# Patient Record
Sex: Female | Born: 1985 | Race: White | Hispanic: No | Marital: Single | Smoking: Former smoker
Health system: Southern US, Community
[De-identification: ages and names within clinical notes are randomized; demographics above are authoritative.]

## PROBLEM LIST (undated history)

## (undated) DIAGNOSIS — F32A Depression, unspecified: Secondary | ICD-10-CM

## (undated) DIAGNOSIS — F419 Anxiety disorder, unspecified: Secondary | ICD-10-CM

## (undated) DIAGNOSIS — F329 Major depressive disorder, single episode, unspecified: Secondary | ICD-10-CM

## (undated) HISTORY — DX: Depression, unspecified: F32.A

## (undated) HISTORY — PX: APPENDECTOMY: SHX54

## (undated) HISTORY — DX: Anxiety disorder, unspecified: F41.9

## (undated) HISTORY — DX: Major depressive disorder, single episode, unspecified: F32.9

---
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
## 2013-11-21 ENCOUNTER — Ambulatory Visit (INDEPENDENT_AMBULATORY_CARE_PROVIDER_SITE_OTHER): Payer: Medicaid Other | Admitting: Nurse Practitioner

## 2013-11-21 ENCOUNTER — Encounter (INDEPENDENT_AMBULATORY_CARE_PROVIDER_SITE_OTHER): Payer: Self-pay

## 2013-11-21 ENCOUNTER — Encounter: Payer: Self-pay | Admitting: Nurse Practitioner

## 2013-11-21 DIAGNOSIS — F3289 Other specified depressive episodes: Secondary | ICD-10-CM

## 2013-11-21 DIAGNOSIS — F329 Major depressive disorder, single episode, unspecified: Secondary | ICD-10-CM

## 2013-11-21 DIAGNOSIS — Z713 Dietary counseling and surveillance: Secondary | ICD-10-CM

## 2013-11-21 DIAGNOSIS — F32A Depression, unspecified: Secondary | ICD-10-CM

## 2013-11-21 DIAGNOSIS — R635 Abnormal weight gain: Secondary | ICD-10-CM

## 2013-11-21 DIAGNOSIS — Z789 Other specified health status: Secondary | ICD-10-CM

## 2013-11-21 MED ORDER — NORGESTIM-ETH ESTRAD TRIPHASIC 0.18/0.215/0.25 MG-35 MCG PO TABS: 1.0000 | ORAL_TABLET | Freq: Every day | ORAL | Status: DC

## 2013-11-21 MED ORDER — VENLAFAXINE HCL ER 75 MG PO CP24: 75.0000 mg | ORAL_CAPSULE | Freq: Every day | ORAL | Status: DC

## 2013-11-21 NOTE — Patient Instructions (Signed)

## 2013-11-21 NOTE — Progress Notes (Signed)
   Subjective:    Patient ID: Tara Randall, female    DOB: 09/15/1985, 28 y.o.   MRN: [id number]  HPI Patient here today to discuss going back on meds: Depression Patient has put herself back on effexor when she quit breast feeding. And she needs a refill on meds-SHe does real good on effecxor and would like to stay on it. Birth Control Wants to go back on orthotricyclen since she is no longer breast feeding. LMP 11/01/13- normal  Review of Systems  Constitutional: Negative.   HENT: Negative.   Respiratory: Negative.   Cardiovascular: Negative.   Gastrointestinal: Negative.   Genitourinary: Negative.   Psychiatric/Behavioral: Negative.   All other systems reviewed and are negative.      Objective:   Physical Exam  Constitutional: She is oriented to person, place, and time. She appears well-developed and well-nourished.  HENT:  Nose: Nose normal.  Mouth/Throat: Oropharynx is clear and moist.  Eyes: EOM are normal.  Neck: Trachea normal, normal range of motion and full passive range of motion without pain. Neck supple. No JVD present. Carotid bruit is not present. No thyromegaly present.  Cardiovascular: Normal rate, regular rhythm, normal heart sounds and intact distal pulses.  Exam reveals no gallop and no friction rub.   No murmur heard. Pulmonary/Chest: Effort normal and breath sounds normal.  Abdominal: Soft. Bowel sounds are normal. She exhibits no distension and no mass. There is no tenderness.  Musculoskeletal: Normal range of motion.  Lymphadenopathy:    She has no cervical adenopathy.  Neurological: She is alert and oriented to person, place, and time. She has normal reflexes.  Skin: Skin is warm and dry.  Psychiatric: She has a normal mood and affect. Her behavior is normal. Judgment and thought content normal.     BP 126/89  Pulse 98  Temp(Src) 98.2 F (36.8 C) (Oral)  Ht 5' 5" (1.651 m)  Wt 265 lb (120.203 kg)  BMI 44.10 kg/m2      Assessment & Plan:    1. Severe obesity (BMI >= 40)   2. Weight loss counseling, encounter for   3. Depression   4. Uses birth control    Orders Placed This Encounter  Procedures  . CMP14+EGFR  . NMR, lipoprofile  . Thyroid Panel With TSH   Meds ordered this encounter  Medications  . Norgestimate-Ethinyl Estradiol Triphasic (ORTHO TRI-CYCLEN, 28,) 0.18/0.215/0.25 MG-35 MCG tablet    Sig: Take 1 tablet by mouth daily.    Dispense:  1 Package    Refill:  11    Order Specific Question:  Supervising Provider    Answer:  MOORE, DONALD W [[id number]]  . venlafaxine XR (EFFEXOR XR) 75 MG 24 hr capsule    Sig: Take 1 capsule (75 mg total) by mouth daily with breakfast.    Dispense:  30 capsule    Refill:  5    Order Specific Question:  Supervising Provider    Answer:  MOORE, DONALD W [[id number]]    Labs pending Health maintenance reviewed Diet and exercise encouraged Continue all meds Follow up  In 6 months   Mary-Margaret , FNP    

## 2013-11-22 LAB — CMP14+EGFR
A/G RATIO: 1.9 (ref 1.1–2.5)
ALT: 19 IU/L (ref 0–32)
AST: 19 IU/L (ref 0–40)
Albumin: 4.3 g/dL (ref 3.5–5.5)
Alkaline Phosphatase: 76 IU/L (ref 39–117)
BUN/Creatinine Ratio: 16 (ref 8–20)
BUN: 14 mg/dL (ref 6–20)
CO2: 26 mmol/L (ref 18–29)
Calcium: 9.5 mg/dL (ref 8.7–10.2)
Chloride: 100 mmol/L (ref 97–108)
Creatinine, Ser: 0.9 mg/dL (ref 0.57–1.00)
GFR calc Af Amer: 101 mL/min/{1.73_m2} (ref >59)
GFR, EST NON AFRICAN AMERICAN: 87 mL/min/{1.73_m2} (ref >59)
Globulin, Total: 2.3 g/dL (ref 1.5–4.5)
Glucose: 89 mg/dL (ref 65–99)
POTASSIUM: 4.6 mmol/L (ref 3.5–5.2)
SODIUM: 139 mmol/L (ref 134–144)
TOTAL PROTEIN: 6.6 g/dL (ref 6.0–8.5)
Total Bilirubin: 0.3 mg/dL (ref 0.0–1.2)

## 2013-11-22 LAB — THYROID PANEL WITH TSH
Free Thyroxine Index: 2.2 (ref 1.2–4.9)
T3 Uptake Ratio: 27 % (ref 24–39)
T4 TOTAL: 8 ug/dL (ref 4.5–12.0)
TSH: 1.4 u[IU]/mL (ref 0.450–4.500)

## 2013-11-22 LAB — NMR, LIPOPROFILE
Cholesterol: 186 mg/dL (ref <200)
HDL Cholesterol by NMR: 57 mg/dL (ref >=40)
HDL Particle Number: 40.4 umol/L (ref >=30.5)
LDL Particle Number: 1049 nmol/L — ABNORMAL HIGH (ref <1000)
LDL SIZE: 21.8 nm (ref >20.5)
LDLC SERPL CALC-MCNC: 101 mg/dL — ABNORMAL HIGH (ref <100)
LP-IR Score: 45 (ref <=45)
SMALL LDL PARTICLE NUMBER: 212 nmol/L (ref <=527)
TRIGLYCERIDES BY NMR: 141 mg/dL (ref <150)

## 2013-12-08 ENCOUNTER — Telehealth: Payer: Self-pay | Admitting: Nurse Practitioner

## 2013-12-08 NOTE — Telephone Encounter (Signed)
[facility] but patient did not come to pick up for 9 days and it was put back in stock.

## 2014-01-26 ENCOUNTER — Ambulatory Visit (INDEPENDENT_AMBULATORY_CARE_PROVIDER_SITE_OTHER): Payer: Medicaid Other | Admitting: Nurse Practitioner

## 2014-01-26 ENCOUNTER — Encounter: Payer: Self-pay | Admitting: Nurse Practitioner

## 2014-01-26 VITALS — BP 120/80 | HR 87 | Temp 98.7°F | Ht 65.0 in | Wt 273.8 lb

## 2014-01-26 DIAGNOSIS — F329 Major depressive disorder, single episode, unspecified: Secondary | ICD-10-CM | POA: Insufficient documentation

## 2014-01-26 DIAGNOSIS — B373 Candidiasis of vulva and vagina: Secondary | ICD-10-CM

## 2014-01-26 DIAGNOSIS — N898 Other specified noninflammatory disorders of vagina: Secondary | ICD-10-CM

## 2014-01-26 DIAGNOSIS — N760 Acute vaginitis: Secondary | ICD-10-CM

## 2014-01-26 DIAGNOSIS — F32A Depression, unspecified: Secondary | ICD-10-CM

## 2014-01-26 DIAGNOSIS — L293 Anogenital pruritus, unspecified: Secondary | ICD-10-CM

## 2014-01-26 DIAGNOSIS — B3731 Acute candidiasis of vulva and vagina: Secondary | ICD-10-CM

## 2014-01-26 DIAGNOSIS — A499 Bacterial infection, unspecified: Secondary | ICD-10-CM

## 2014-01-26 DIAGNOSIS — F3289 Other specified depressive episodes: Secondary | ICD-10-CM

## 2014-01-26 DIAGNOSIS — F411 Generalized anxiety disorder: Secondary | ICD-10-CM | POA: Insufficient documentation

## 2014-01-26 DIAGNOSIS — F988 Other specified behavioral and emotional disorders with onset usually occurring in childhood and adolescence: Secondary | ICD-10-CM | POA: Insufficient documentation

## 2014-01-26 DIAGNOSIS — B9689 Other specified bacterial agents as the cause of diseases classified elsewhere: Secondary | ICD-10-CM

## 2014-01-26 LAB — POCT WET PREP (WET MOUNT)
KOH WET PREP POC: NEGATIVE
Trichomonas Wet Prep HPF POC: NEGATIVE

## 2014-01-26 MED ORDER — METRONIDAZOLE 500 MG PO TABS: 500.0000 mg | ORAL_TABLET | Freq: Two times a day (BID) | ORAL | Status: DC

## 2014-01-26 MED ORDER — FLUCONAZOLE 150 MG PO TABS: ORAL_TABLET | ORAL | Status: DC

## 2014-01-26 MED ORDER — AMPHETAMINE-DEXTROAMPHET ER 20 MG PO CP24
20.0000 mg | ORAL_CAPSULE | Freq: Every day | ORAL | Status: DC
Start: 2014-01-26 — End: 2014-03-27

## 2014-01-26 MED ORDER — AMPHETAMINE-DEXTROAMPHET ER 20 MG PO CP24: 20.0000 mg | ORAL_CAPSULE | ORAL | Status: DC

## 2014-01-26 NOTE — Progress Notes (Signed)
Subjective:    Patient ID: Tara Randall, female    DOB: 12-12-1985, 28 y.o.   MRN: [id number]  HPI  PAtient here today to get med refills- She had stop taking all of her meds about 1 1/2 years ago because she got preganant and had to stop- She has started back on her effexor since then. Has been waiting on  Medical records to treat her ADD- she was on adderall 20 last time she had filled- She states that she looses concentration and is unable to complete tasks. Needs to get back on her adderall.  * She has a yeast infection and has been treating it with OTC medication with no relief * bil esar pain intermittently X 1 week   Review of Systems  Constitutional: Positive for fever.  HENT: Positive for ear pain. Negative for congestion.   Respiratory: Negative for cough and shortness of breath.   Cardiovascular: Negative.   Gastrointestinal: Negative.   Genitourinary: Negative.   Neurological: Negative.   Psychiatric/Behavioral: Negative.   All other systems reviewed and are negative.      Objective:   Physical Exam  Constitutional: She is oriented to person, place, and time. She appears well-developed and well-nourished.  HENT:  Nose: Nose normal.  Mouth/Throat: Oropharynx is clear and moist.  Eyes: EOM are normal.  Neck: Trachea normal, normal range of motion and full passive range of motion without pain. Neck supple. No JVD present. Carotid bruit is not present. No thyromegaly present.  Cardiovascular: Normal rate, regular rhythm, normal heart sounds and intact distal pulses.  Exam reveals no gallop and no friction rub.   No murmur heard. Pulmonary/Chest: Effort normal and breath sounds normal.  Abdominal: Soft. Bowel sounds are normal. She exhibits no distension and no mass. There is no tenderness.  Musculoskeletal: Normal range of motion.  Lymphadenopathy:    She has no cervical adenopathy.  Neurological: She is alert and oriented to person, place, and time. She has normal  reflexes.  Skin: Skin is warm and dry.  Birth mark on left anterior chest wall  Psychiatric: She has a normal mood and affect. Her behavior is normal. Judgment and thought content normal.   BP 120/80  Pulse 87  Temp(Src) 98.7 F (37.1 C) (Oral)  Ht 5\' 5"  (1.651 m)  Wt 273 lb 12.8 oz (124.195 kg)  BMI 45.56 kg/m2  LMP 12/29/2013   Results for orders placed in visit on 01/26/14  POCT WET PREP ([place] [place])      Result Value Ref Range   Source Wet Prep POC vagina     WBC, Wet Prep HPF POC 1-5     Bacteria Wet Prep HPF POC many     Clue Cells Wet Prep HPF POC Few     Yeast Wet Prep HPF POC None     KOH Wet Prep POC NEG     Trichomonas Wet Prep HPF POC NEG          Assessment & Plan:  1. Depression 2. GAD (generalized anxiety disorder) Continue effexor as rx Stress managemant  3. Vaginal itching - POCT Wet Prep [facility]([place] [place])  4. Vaginal candidiasis No douching No bubble baths - fluconazole (DIFLUCAN) 150 MG tablet; 1 po now and repeat in 1 week  Dispense: 2 tablet; Refill: 0  5. Bacterial vaginosis - metroNIDAZOLE (FLAGYL) 500 MG tablet; Take 1 tablet (500 mg total) by mouth 2 (two) times daily.  Dispense: 14 tablet; Refill: 0  6. ADD (attention deficit  disorder) Follow up in 2 months ofr medication refills - amphetamine-dextroamphetamine (ADDERALL XR) 20 MG 24 hr capsule; Take 1 capsule (20 mg total) by mouth every morning.  Dispense: 30 capsule; Refill: 0 - amphetamine-dextroamphetamine (ADDERALL XR) 20 MG 24 hr capsule; Take 1 capsule (20 mg total) by mouth daily.  Dispense: 30 capsule; Refill: 0   Mary-Margaret Daphine DeutscherMartin, FNP

## 2014-01-26 NOTE — Patient Instructions (Signed)
Bacterial Vaginosis Bacterial vaginosis is a vaginal infection that occurs when the normal balance of bacteria in the vagina is disrupted. It results from an overgrowth of certain bacteria. This is the most common vaginal infection in women of childbearing age. Treatment is important to prevent complications, especially in pregnant women, as it can cause a premature delivery. CAUSES  Bacterial vaginosis is caused by an increase in harmful bacteria that are normally present in smaller amounts in the vagina. Several different kinds of bacteria can cause bacterial vaginosis. However, the reason that the condition develops is not fully understood. RISK FACTORS Certain activities or behaviors can put you at an increased risk of developing bacterial vaginosis, including:  Having a new sex partner or multiple sex partners.  Douching.  Using an intrauterine device (IUD) for contraception. Women do not get bacterial vaginosis from toilet seats, bedding, swimming pools, or contact with objects around them. SIGNS AND SYMPTOMS  Some women with bacterial vaginosis have no signs or symptoms. Common symptoms include:  Grey vaginal discharge.  A fishlike odor with discharge, especially after sexual intercourse.  Itching or burning of the vagina and vulva.  Burning or pain with urination. DIAGNOSIS  Your health care provider will take a medical history and examine the vagina for signs of bacterial vaginosis. A sample of vaginal fluid may be taken. Your health care provider will look at this sample under a microscope to check for bacteria and abnormal cells. A vaginal pH test may also be done.  TREATMENT  Bacterial vaginosis may be treated with antibiotic medicines. These may be given in the form of a pill or a vaginal cream. A second round of antibiotics may be prescribed if the condition comes back after treatment.  HOME CARE INSTRUCTIONS   Only take over-the-counter or prescription medicines as  directed by your health care provider.  If antibiotic medicine was prescribed, take it as directed. Make sure you finish it even if you start to feel better.  Do not have sex until treatment is completed.  Tell all sexual partners that you have a vaginal infection. They should see their health care provider and be treated if they have problems, such as a mild rash or itching.  Practice safe sex by using condoms and only having one sex partner. SEEK MEDICAL CARE IF:   Your symptoms are not improving after 3 days of treatment.  You have increased discharge or pain.  You have a fever. MAKE SURE YOU:   Understand these instructions.  Will watch your condition.  Will get help right away if you are not doing well or get worse. FOR MORE INFORMATION  Centers for Disease Control and Prevention, Division of STD Prevention: www.cdc.gov/std American Sexual Health Association (ASHA): www.ashastd.org  Document Released: 07/17/2005 Document Revised: 05/07/2013 Document Reviewed: 02/26/2013 ExitCare Patient Information 2015 [employer]. This information is not intended to replace advice given to you by your health care provider. Make sure you discuss any questions you have with your health care provider.  

## 2014-03-27 ENCOUNTER — Encounter: Payer: Self-pay | Admitting: Nurse Practitioner

## 2014-03-27 ENCOUNTER — Ambulatory Visit (INDEPENDENT_AMBULATORY_CARE_PROVIDER_SITE_OTHER): Payer: Medicaid Other | Admitting: Nurse Practitioner

## 2014-03-27 VITALS — BP 138/86 | HR 85 | Temp 97.2°F | Ht 65.0 in | Wt 272.0 lb

## 2014-03-27 DIAGNOSIS — F988 Other specified behavioral and emotional disorders with onset usually occurring in childhood and adolescence: Secondary | ICD-10-CM

## 2014-03-27 DIAGNOSIS — F411 Generalized anxiety disorder: Secondary | ICD-10-CM

## 2014-03-27 MED ORDER — VENLAFAXINE HCL ER 150 MG PO CP24: 150.0000 mg | ORAL_CAPSULE | Freq: Every day | ORAL | Status: DC

## 2014-03-27 MED ORDER — AMPHETAMINE-DEXTROAMPHET ER 20 MG PO CP24: 20.0000 mg | ORAL_CAPSULE | Freq: Every day | ORAL | Status: DC

## 2014-03-27 MED ORDER — AMPHETAMINE-DEXTROAMPHET ER 20 MG PO CP24: 20.0000 mg | ORAL_CAPSULE | ORAL | Status: DC

## 2014-03-27 NOTE — Patient Instructions (Signed)
Stress and Stress Management Stress is a normal reaction to life events. It is what you feel when life demands more than you are used to or more than you can handle. Some stress can be useful. For example, the stress reaction can help you catch the last bus of the day, study for a test, or meet a deadline at work. But stress that occurs too often or for too long can cause problems. It can affect your emotional health and interfere with relationships and normal daily activities. Too much stress can weaken your immune system and increase your risk for physical illness. If you already have a medical problem, stress can make it worse. CAUSES  All sorts of life events may cause stress. An event that causes stress for one person may not be stressful for another person. Major life events commonly cause stress. These may be positive or negative. Examples include losing your job, moving into a new home, getting married, having a baby, or losing a loved one. Less obvious life events may also cause stress, especially if they occur day after day or in combination. Examples include working long hours, driving in traffic, caring for children, being in debt, or being in a difficult relationship. SIGNS AND SYMPTOMS Stress may cause emotional symptoms including, the following:  Anxiety. This is feeling worried, afraid, on edge, overwhelmed, or out of control.  Anger. This is feeling irritated or impatient.  Depression. This is feeling sad, down, helpless, or guilty.  Difficulty focusing, remembering, or making decisions. Stress may cause physical symptoms, including the following:   Aches and pains. These may affect your head, neck, back, stomach, or other areas of your body.  Tight muscles or clenched jaw.  Low energy or trouble sleeping. Stress may cause unhealthy behaviors, including the following:   Eating to feel better (overeating) or skipping meals.  Sleeping too little, too much, or both.  Working  too much or putting off tasks (procrastination).  Smoking, drinking alcohol, or using drugs to feel better. DIAGNOSIS  Stress is diagnosed through an assessment by your health care provider. Your health care provider will ask questions about your symptoms and any stressful life events.Your health care provider will also ask about your medical history and may order blood tests or other tests. Certain medical conditions and medicine can cause physical symptoms similar to stress. Mental illness can cause emotional symptoms and unhealthy behaviors similar to stress. Your health care provider may refer you to a mental health professional for further evaluation.  TREATMENT  Stress management is the recommended treatment for stress.The goals of stress management are reducing stressful life events and coping with stress in healthy ways.  Techniques for reducing stressful life events include the following:  Stress identification. Self-monitor for stress and identify what causes stress for you. These skills may help you to avoid some stressful events.  Time management. Set your priorities, keep a calendar of events, and learn to say "no." These tools can help you avoid making too many commitments. Techniques for coping with stress include the following:  Rethinking the problem. Try to think realistically about stressful events rather than ignoring them or overreacting. Try to find the positives in a stressful situation rather than focusing on the negatives.  Exercise. Physical exercise can release both physical and emotional tension. The key is to find a form of exercise you enjoy and do it regularly.  Relaxation techniques. These relax the body and mind. Examples include yoga, meditation, tai chi, biofeedback, deep  breathing, progressive muscle relaxation, listening to music, being out in nature, journaling, and other hobbies. Again, the key is to find one or more that you enjoy and can do  regularly.  Healthy lifestyle. Eat a balanced diet, get plenty of sleep, and do not smoke. Avoid using alcohol or drugs to relax.  Strong support network. Spend time with family, friends, or other people you enjoy being around.Express your feelings and talk things over with someone you trust. Counseling or talktherapy with a mental health professional may be helpful if you are having difficulty managing stress on your own. Medicine is typically not recommended for the treatment of stress.Talk to your health care provider if you think you need medicine for symptoms of stress. HOME CARE INSTRUCTIONS  Keep all follow-up visits as directed by your health care provider.  Take all medicines as directed by your health care provider. SEEK MEDICAL CARE IF:  Your symptoms get worse or you start having new symptoms.  You feel overwhelmed by your problems and can no longer manage them on your own. SEEK IMMEDIATE MEDICAL CARE IF:  You feel like hurting yourself or someone else. Document Released: 01/10/2001 Document Revised: 12/01/2013 Document Reviewed: 03/11/2013 ExitCare Patient Information 2015 [employer]. This information is not intended to replace advice given to you by your health care provider. Make sure you discuss any questions you have with your health care provider.  

## 2014-03-27 NOTE — Progress Notes (Signed)
   Subjective:    Patient ID: Tara Randall, female    DOB: 1986/06/26, 28 y.o.   MRN: [id number]  HPI Patient brought in today by self for follow up of ADD. Currently taking adderall XR  daily. Behavior- good Grades- no longer school Medication side effects - none Weight loss- none Sleeping habits- terrible due to newborn Any concerns- none  * patient tearful during exam saying that she is not getting much sleep at night because of a newborn that cries all night.   Review of Systems  Constitutional: Negative.   HENT: Negative.   Respiratory: Negative.   Cardiovascular: Negative.   Genitourinary: Negative.   Neurological: Negative.   Psychiatric/Behavioral: Negative.   All other systems reviewed and are negative.      Objective:   Physical Exam  Constitutional: She is oriented to person, place, and time. She appears well-developed and well-nourished.  Cardiovascular: Normal rate, regular rhythm and normal heart sounds.   Pulmonary/Chest: Effort normal and breath sounds normal.  Musculoskeletal: Normal range of motion.  Neurological: She is alert and oriented to person, place, and time.  Skin: Skin is warm.  Psychiatric: She has a normal mood and affect. Her behavior is normal. Judgment and thought content normal.  Tearful during exam    BP 138/86  Pulse 85  Temp(Src) 97.2 F (36.2 C) (Oral)  Ht  (1.651 m)  Wt 272 lb (123.378 kg)  BMI 45.26 kg/m2       Assessment & Plan:  1. ADD (attention deficit disorder) - amphetamine-dextroamphetamine (ADDERALL XR) 20 MG 24 hr capsule; Take 1 capsule (20 mg total) by mouth daily.  Dispense: 30 capsule; Refill: 0 - amphetamine-dextroamphetamine (ADDERALL XR) 20 MG 24 hr capsule; Take 1 capsule (20 mg total) by mouth every morning.  Dispense: 30 capsule; Refill: 0  2. GAD (generalized anxiety disorder) stress management - venlafaxine XR (EFFEXOR XR) 150 MG 24 hr capsule; Take 1 capsule (150 mg total) by mouth daily  with breakfast.  Dispense: 30 capsule; Refill: 5  Mary-Margaret Daphine Deutscher, FNP

## 2014-05-22 ENCOUNTER — Encounter: Payer: Self-pay | Admitting: Nurse Practitioner

## 2014-05-22 ENCOUNTER — Ambulatory Visit (INDEPENDENT_AMBULATORY_CARE_PROVIDER_SITE_OTHER): Payer: Medicaid Other | Admitting: Nurse Practitioner

## 2014-05-22 VITALS — BP 123/88 | HR 96 | Temp 99.2°F | Ht 65.0 in | Wt 267.2 lb

## 2014-05-22 DIAGNOSIS — Z23 Encounter for immunization: Secondary | ICD-10-CM

## 2014-05-22 DIAGNOSIS — F988 Other specified behavioral and emotional disorders with onset usually occurring in childhood and adolescence: Secondary | ICD-10-CM

## 2014-05-22 DIAGNOSIS — F32A Depression, unspecified: Secondary | ICD-10-CM

## 2014-05-22 DIAGNOSIS — F909 Attention-deficit hyperactivity disorder, unspecified type: Secondary | ICD-10-CM

## 2014-05-22 DIAGNOSIS — F411 Generalized anxiety disorder: Secondary | ICD-10-CM

## 2014-05-22 DIAGNOSIS — F329 Major depressive disorder, single episode, unspecified: Secondary | ICD-10-CM

## 2014-05-22 MED ORDER — AMPHETAMINE-DEXTROAMPHET ER 20 MG PO CP24: 20.0000 mg | ORAL_CAPSULE | ORAL | Status: DC

## 2014-05-22 MED ORDER — AMPHETAMINE-DEXTROAMPHET ER 20 MG PO CP24: 20.0000 mg | ORAL_CAPSULE | Freq: Every day | ORAL | Status: DC

## 2014-05-22 NOTE — Patient Instructions (Signed)
Stress and Stress Management Stress is a normal reaction to life events. It is what you feel when life demands more than you are used to or more than you can handle. Some stress can be useful. For example, the stress reaction can help you catch the last bus of the day, study for a test, or meet a deadline at work. But stress that occurs too often or for too long can cause problems. It can affect your emotional health and interfere with relationships and normal daily activities. Too much stress can weaken your immune system and increase your risk for physical illness. If you already have a medical problem, stress can make it worse. CAUSES  All sorts of life events may cause stress. An event that causes stress for one person may not be stressful for another person. Major life events commonly cause stress. These may be positive or negative. Examples include losing your job, moving into a new home, getting married, having a baby, or losing a loved one. Less obvious life events may also cause stress, especially if they occur day after day or in combination. Examples include working long hours, driving in traffic, caring for children, being in debt, or being in a difficult relationship. SIGNS AND SYMPTOMS Stress may cause emotional symptoms including, the following:  Anxiety. This is feeling worried, afraid, on edge, overwhelmed, or out of control.  Anger. This is feeling irritated or impatient.  Depression. This is feeling sad, down, helpless, or guilty.  Difficulty focusing, remembering, or making decisions. Stress may cause physical symptoms, including the following:   Aches and pains. These may affect your head, neck, back, stomach, or other areas of your body.  Tight muscles or clenched jaw.  Low energy or trouble sleeping. Stress may cause unhealthy behaviors, including the following:   Eating to feel better (overeating) or skipping meals.  Sleeping too little, too much, or both.  Working  too much or putting off tasks (procrastination).  Smoking, drinking alcohol, or using drugs to feel better. DIAGNOSIS  Stress is diagnosed through an assessment by your health care provider. Your health care provider will ask questions about your symptoms and any stressful life events.Your health care provider will also ask about your medical history and may order blood tests or other tests. Certain medical conditions and medicine can cause physical symptoms similar to stress. Mental illness can cause emotional symptoms and unhealthy behaviors similar to stress. Your health care provider may refer you to a mental health professional for further evaluation.  TREATMENT  Stress management is the recommended treatment for stress.The goals of stress management are reducing stressful life events and coping with stress in healthy ways.  Techniques for reducing stressful life events include the following:  Stress identification. Self-monitor for stress and identify what causes stress for you. These skills may help you to avoid some stressful events.  Time management. Set your priorities, keep a calendar of events, and learn to say "no." These tools can help you avoid making too many commitments. Techniques for coping with stress include the following:  Rethinking the problem. Try to think realistically about stressful events rather than ignoring them or overreacting. Try to find the positives in a stressful situation rather than focusing on the negatives.  Exercise. Physical exercise can release both physical and emotional tension. The key is to find a form of exercise you enjoy and do it regularly.  Relaxation techniques. These relax the body and mind. Examples include yoga, meditation, tai chi, biofeedback, deep  breathing, progressive muscle relaxation, listening to music, being out in nature, journaling, and other hobbies. Again, the key is to find one or more that you enjoy and can do  regularly.  Healthy lifestyle. Eat a balanced diet, get plenty of sleep, and do not smoke. Avoid using alcohol or drugs to relax.  Strong support network. Spend time with family, friends, or other people you enjoy being around.Express your feelings and talk things over with someone you trust. Counseling or talktherapy with a mental health professional may be helpful if you are having difficulty managing stress on your own. Medicine is typically not recommended for the treatment of stress.Talk to your health care provider if you think you need medicine for symptoms of stress. HOME CARE INSTRUCTIONS  Keep all follow-up visits as directed by your health care provider.  Take all medicines as directed by your health care provider. SEEK MEDICAL CARE IF:  Your symptoms get worse or you start having new symptoms.  You feel overwhelmed by your problems and can no longer manage them on your own. SEEK IMMEDIATE MEDICAL CARE IF:  You feel like hurting yourself or someone else. Document Released: 01/10/2001 Document Revised: 12/01/2013 Document Reviewed: 03/11/2013 ExitCare Patient Information 2015 [employer]. This information is not intended to replace advice given to you by your health care provider. Make sure you discuss any questions you have with your health care provider.  

## 2014-05-22 NOTE — Progress Notes (Signed)
   Subjective:    Patient ID: Jena Gaussmily Koslow, female    DOB: 11/03/1985, 28 y.o.   MRN: [id number]  HPI Patient in today for follow up of GAD and depression- last visit 2 months ago she was c/o feeling down- we increased her effexor and she says that she is doing much better.  No side effects- Patient also has ADD and is on adderall XR20mg - doing well - able to concentrate at home and keeps her focused.    Review of Systems  Constitutional: Negative.   HENT: Negative.   Respiratory: Negative.   Cardiovascular: Negative.   Genitourinary: Negative.   Neurological: Negative.   Psychiatric/Behavioral: Negative.   All other systems reviewed and are negative.      Objective:   Physical Exam  Constitutional: She is oriented to person, place, and time. She appears well-developed and well-nourished.  Cardiovascular: Normal rate, regular rhythm and normal heart sounds.   Pulmonary/Chest: Effort normal and breath sounds normal.  Neurological: She is alert and oriented to person, place, and time.  Skin: Skin is warm and dry.  Psychiatric: She has a normal mood and affect. Her behavior is normal. Judgment and thought content normal.   BP 123/88  Pulse 96  Temp(Src) 99.2 F (37.3 C) (Oral)  Ht 5\' 5"  (1.651 m)  Wt 267 lb 3.2 oz (121.201 kg)  BMI 44.46 kg/m2  LMP 05/01/2014        Assessment & Plan:  1. ADD (attention deficit disorder) Continue current meds - amphetamine-dextroamphetamine (ADDERALL XR) 20 MG 24 hr capsule; Take 1 capsule (20 mg total) by mouth daily.  Dispense: 30 capsule; Refill: 0 - amphetamine-dextroamphetamine (ADDERALL XR) 20 MG 24 hr capsule; Take 1 capsule (20 mg total) by mouth every morning.  Dispense: 30 capsule; Refill: 0  2. GAD (generalized anxiety disorder) Stress management  3. Depression   Follow up in 3-4 months  Mary-Margaret Daphine DeutscherMartin, FNP

## 2014-08-10 ENCOUNTER — Telehealth: Payer: Self-pay | Admitting: Nurse Practitioner

## 2014-08-10 DIAGNOSIS — F988 Other specified behavioral and emotional disorders with onset usually occurring in childhood and adolescence: Secondary | ICD-10-CM

## 2014-08-10 NOTE — Telephone Encounter (Signed)
Please review and advise.

## 2014-08-11 ENCOUNTER — Telehealth: Payer: Self-pay | Admitting: *Deleted

## 2014-08-11 MED ORDER — AMPHETAMINE-DEXTROAMPHET ER 20 MG PO CP24: 20.0000 mg | ORAL_CAPSULE | Freq: Every day | ORAL | Status: DC

## 2014-08-11 NOTE — Telephone Encounter (Signed)
adderall rx ready for pick up  

## 2014-08-11 NOTE — Telephone Encounter (Signed)
LM,  Script is ready of adderall.

## 2014-08-24 ENCOUNTER — Encounter: Payer: Self-pay | Admitting: Nurse Practitioner

## 2014-08-24 ENCOUNTER — Ambulatory Visit (INDEPENDENT_AMBULATORY_CARE_PROVIDER_SITE_OTHER): Payer: Medicaid Other | Admitting: Nurse Practitioner

## 2014-08-24 VITALS — BP 136/89 | HR 96 | Temp 98.1°F | Ht 65.0 in | Wt 262.0 lb

## 2014-08-24 DIAGNOSIS — F329 Major depressive disorder, single episode, unspecified: Secondary | ICD-10-CM

## 2014-08-24 DIAGNOSIS — F988 Other specified behavioral and emotional disorders with onset usually occurring in childhood and adolescence: Secondary | ICD-10-CM

## 2014-08-24 DIAGNOSIS — F32A Depression, unspecified: Secondary | ICD-10-CM

## 2014-08-24 DIAGNOSIS — F909 Attention-deficit hyperactivity disorder, unspecified type: Secondary | ICD-10-CM

## 2014-08-24 MED ORDER — AMPHETAMINE-DEXTROAMPHET ER 20 MG PO CP24: 20.0000 mg | ORAL_CAPSULE | ORAL | Status: DC

## 2014-08-24 MED ORDER — VENLAFAXINE HCL ER 75 MG PO CP24: 75.0000 mg | ORAL_CAPSULE | Freq: Every day | ORAL | Status: DC

## 2014-08-24 MED ORDER — AMPHETAMINE-DEXTROAMPHET ER 20 MG PO CP24: 20.0000 mg | ORAL_CAPSULE | Freq: Every day | ORAL | Status: DC

## 2014-08-24 NOTE — Progress Notes (Signed)
   Subjective:    Patient ID: Tara Randall, female    DOB: 30-Mar-1986, 29 y.o.   MRN: [id number]  HPI Patient in today for follow up of : -ADHD- currently on adderall XR 20mg  daily- patient is dong well- able to concentrate- Is gettong ready to go back to college- no side effects - Depression- on effexor 150 mg- working well but ths dose has increased her diarrhea- thought it would get better over time but has not. Would like to go back down to 75mg .    Review of Systems  Constitutional: Negative.   Respiratory: Negative.   Cardiovascular: Negative.   Gastrointestinal: Positive for diarrhea.  Genitourinary: Negative.   Neurological: Negative.   Psychiatric/Behavioral: Negative.   All other systems reviewed and are negative.      Objective:   Physical Exam  Constitutional: She is oriented to person, place, and time. She appears well-developed and well-nourished.  Cardiovascular: Normal rate, regular rhythm and normal heart sounds.   Pulmonary/Chest: Effort normal and breath sounds normal.  Neurological: She is alert and oriented to person, place, and time.  Skin: Skin is warm and dry.  Psychiatric: She has a normal mood and affect. Her behavior is normal. Judgment and thought content normal.   BP 136/89 mmHg  Pulse 96  Temp(Src) 98.1 F (36.7 C) (Oral)  Ht 5\' 5"  (1.651 m)  Wt 262 lb (118.842 kg)  BMI 43.60 kg/m2        Assessment & Plan:  1. ADD (attention deficit disorder) Stress management - amphetamine-dextroamphetamine (ADDERALL XR) 20 MG 24 hr capsule; Take 1 capsule (20 mg total) by mouth every morning.  Dispense: 30 capsule; Refill: 0 - amphetamine-dextroamphetamine (ADDERALL XR) 20 MG 24 hr capsule; Take 1 capsule (20 mg total) by mouth daily.  Dispense: 30 capsule; Refill: 0 - amphetamine-dextroamphetamine (ADDERALL XR) 20 MG 24 hr capsule; Take 1 capsule (20 mg total) by mouth every morning.  Dispense: 30 capsule; Refill: 0  2. Depression Stress  manamgement - venlafaxine XR (EFFEXOR XR) 75 MG 24 hr capsule; Take 1 capsule (75 mg total) by mouth daily with breakfast.  Dispense: 30 capsule; Refill: 5  Follow up in 3 months  Mary-Margaret Daphine DeutscherMartin, FNP

## 2014-08-24 NOTE — Patient Instructions (Signed)
Stress and Stress Management Stress is a normal reaction to life events. It is what you feel when life demands more than you are used to or more than you can handle. Some stress can be useful. For example, the stress reaction can help you catch the last bus of the day, study for a test, or meet a deadline at work. But stress that occurs too often or for too long can cause problems. It can affect your emotional health and interfere with relationships and normal daily activities. Too much stress can weaken your immune system and increase your risk for physical illness. If you already have a medical problem, stress can make it worse. CAUSES  All sorts of life events may cause stress. An event that causes stress for one person may not be stressful for another person. Major life events commonly cause stress. These may be positive or negative. Examples include losing your job, moving into a new home, getting married, having a baby, or losing a loved one. Less obvious life events may also cause stress, especially if they occur day after day or in combination. Examples include working long hours, driving in traffic, caring for children, being in debt, or being in a difficult relationship. SIGNS AND SYMPTOMS Stress may cause emotional symptoms including, the following:  Anxiety. This is feeling worried, afraid, on edge, overwhelmed, or out of control.  Anger. This is feeling irritated or impatient.  Depression. This is feeling sad, down, helpless, or guilty.  Difficulty focusing, remembering, or making decisions. Stress may cause physical symptoms, including the following:   Aches and pains. These may affect your head, neck, back, stomach, or other areas of your body.  Tight muscles or clenched jaw.  Low energy or trouble sleeping. Stress may cause unhealthy behaviors, including the following:   Eating to feel better (overeating) or skipping meals.  Sleeping too little, too much, or both.  Working  too much or putting off tasks (procrastination).  Smoking, drinking alcohol, or using drugs to feel better. DIAGNOSIS  Stress is diagnosed through an assessment by your health care provider. Your health care provider will ask questions about your symptoms and any stressful life events.Your health care provider will also ask about your medical history and may order blood tests or other tests. Certain medical conditions and medicine can cause physical symptoms similar to stress. Mental illness can cause emotional symptoms and unhealthy behaviors similar to stress. Your health care provider may refer you to a mental health professional for further evaluation.  TREATMENT  Stress management is the recommended treatment for stress.The goals of stress management are reducing stressful life events and coping with stress in healthy ways.  Techniques for reducing stressful life events include the following:  Stress identification. Self-monitor for stress and identify what causes stress for you. These skills may help you to avoid some stressful events.  Time management. Set your priorities, keep a calendar of events, and learn to say "no." These tools can help you avoid making too many commitments. Techniques for coping with stress include the following:  Rethinking the problem. Try to think realistically about stressful events rather than ignoring them or overreacting. Try to find the positives in a stressful situation rather than focusing on the negatives.  Exercise. Physical exercise can release both physical and emotional tension. The key is to find a form of exercise you enjoy and do it regularly.  Relaxation techniques. These relax the body and mind. Examples include yoga, meditation, tai chi, biofeedback, deep  breathing, progressive muscle relaxation, listening to music, being out in nature, journaling, and other hobbies. Again, the key is to find one or more that you enjoy and can do  regularly.  Healthy lifestyle. Eat a balanced diet, get plenty of sleep, and do not smoke. Avoid using alcohol or drugs to relax.  Strong support network. Spend time with family, friends, or other people you enjoy being around.Express your feelings and talk things over with someone you trust. Counseling or talktherapy with a mental health professional may be helpful if you are having difficulty managing stress on your own. Medicine is typically not recommended for the treatment of stress.Talk to your health care provider if you think you need medicine for symptoms of stress. HOME CARE INSTRUCTIONS  Keep all follow-up visits as directed by your health care provider.  Take all medicines as directed by your health care provider. SEEK MEDICAL CARE IF:  Your symptoms get worse or you start having new symptoms.  You feel overwhelmed by your problems and can no longer manage them on your own. SEEK IMMEDIATE MEDICAL CARE IF:  You feel like hurting yourself or someone else. Document Released: 01/10/2001 Document Revised: 12/01/2013 Document Reviewed: 03/11/2013 ExitCare Patient Information 2015 [employer]. This information is not intended to replace advice given to you by your health care provider. Make sure you discuss any questions you have with your health care provider.  

## 2014-11-16 ENCOUNTER — Ambulatory Visit (INDEPENDENT_AMBULATORY_CARE_PROVIDER_SITE_OTHER): Payer: Medicaid Other | Admitting: Nurse Practitioner

## 2014-11-16 ENCOUNTER — Encounter: Payer: Self-pay | Admitting: Nurse Practitioner

## 2014-11-16 VITALS — BP 138/84 | HR 77 | Temp 98.2°F | Ht 65.0 in | Wt 263.0 lb

## 2014-11-16 DIAGNOSIS — F909 Attention-deficit hyperactivity disorder, unspecified type: Secondary | ICD-10-CM | POA: Diagnosis not present

## 2014-11-16 DIAGNOSIS — F988 Other specified behavioral and emotional disorders with onset usually occurring in childhood and adolescence: Secondary | ICD-10-CM

## 2014-11-16 MED ORDER — AMPHETAMINE-DEXTROAMPHET ER 20 MG PO CP24: 20.0000 mg | ORAL_CAPSULE | Freq: Every day | ORAL | Status: DC

## 2014-11-16 MED ORDER — AMPHETAMINE-DEXTROAMPHET ER 20 MG PO CP24: 20.0000 mg | ORAL_CAPSULE | ORAL | Status: DC

## 2014-11-16 NOTE — Progress Notes (Signed)
   Subjective:    Patient ID: Tara Randall, female    DOB: 1985-12-21, 29 y.o.   MRN: [id number]  HPI  Patient in today for follow up of : -ADHD- currently on adderall XR 20mg  daily- patient is ding well- able to concentrate- she is getting ready to start back in school in the fall. no side effects   *she reports seasonal allergies and currently taking OTC zyrtec, benadryl without relief.   Review of Systems  Constitutional: Negative.   Respiratory: Negative.   Cardiovascular: Negative.   Gastrointestinal: Negative.   Genitourinary: Negative.   Neurological: Negative.   Psychiatric/Behavioral: Negative.   All other systems reviewed and are negative.      Objective:   Physical Exam  Constitutional: She is oriented to person, place, and time. She appears well-developed and well-nourished.  Cardiovascular: Normal rate, regular rhythm and normal heart sounds.   Pulmonary/Chest: Effort normal and breath sounds normal.  Neurological: She is alert and oriented to person, place, and time.  Skin: Skin is warm and dry.  Psychiatric: She has a normal mood and affect. Her behavior is normal. Judgment and thought content normal.    BP 138/84 mmHg  Pulse 77  Temp(Src) 98.2 F (36.8 C) (Oral)  Ht 5\' 5"  (1.651 m)  Wt 263 lb (119.296 kg)  BMI 43.77 kg/m2       Assessment & Plan:   1. ADD (attention deficit disorder)    Meds ordered this encounter  Medications  . amphetamine-dextroamphetamine (ADDERALL XR) 20 MG 24 hr capsule    Sig: Take 1 capsule (20 mg total) by mouth every morning.    Dispense:  30 capsule    Refill:  0    DO NOT FILL TILL 01/13/15    Order Specific Question:  Supervising Provider    Answer:  Ernestina PennaMOORE, DONALD W [[id number]]  . amphetamine-dextroamphetamine (ADDERALL XR) 20 MG 24 hr capsule    Sig: Take 1 capsule (20 mg total) by mouth daily.    Dispense:  30 capsule    Refill:  0    DO NOT FILL TILL 12/15/14    Order Specific Question:  Supervising Provider   Answer:  Ernestina PennaMOORE, DONALD W [[id number]]  . amphetamine-dextroamphetamine (ADDERALL XR) 20 MG 24 hr capsule    Sig: Take 1 capsule (20 mg total) by mouth every morning.    Dispense:  30 capsule    Refill:  0    Order Specific Question:  Supervising Provider    Answer:  Deborra MedinaMOORE, DONALD W [[id number]]   Meds as prescribed Behavior modification as needed Follow-up for recheck in 3 months  Mary-Margaret Daphine DeutscherMartin, FNP

## 2014-11-16 NOTE — Patient Instructions (Signed)

## 2014-12-21 ENCOUNTER — Other Ambulatory Visit: Payer: Self-pay | Admitting: Nurse Practitioner

## 2015-02-17 ENCOUNTER — Ambulatory Visit (INDEPENDENT_AMBULATORY_CARE_PROVIDER_SITE_OTHER): Payer: Medicaid Other | Admitting: Nurse Practitioner

## 2015-02-17 ENCOUNTER — Encounter: Payer: Self-pay | Admitting: Nurse Practitioner

## 2015-02-17 VITALS — BP 122/90 | HR 85 | Temp 98.5°F | Ht 65.0 in | Wt 255.0 lb

## 2015-02-17 DIAGNOSIS — F32A Depression, unspecified: Secondary | ICD-10-CM

## 2015-02-17 DIAGNOSIS — F909 Attention-deficit hyperactivity disorder, unspecified type: Secondary | ICD-10-CM | POA: Diagnosis not present

## 2015-02-17 DIAGNOSIS — F329 Major depressive disorder, single episode, unspecified: Secondary | ICD-10-CM

## 2015-02-17 DIAGNOSIS — F411 Generalized anxiety disorder: Secondary | ICD-10-CM | POA: Diagnosis not present

## 2015-02-17 DIAGNOSIS — F988 Other specified behavioral and emotional disorders with onset usually occurring in childhood and adolescence: Secondary | ICD-10-CM

## 2015-02-17 MED ORDER — AMPHETAMINE-DEXTROAMPHET ER 20 MG PO CP24: 20.0000 mg | ORAL_CAPSULE | ORAL | Status: DC

## 2015-02-17 MED ORDER — ESCITALOPRAM OXALATE 10 MG PO TABS: 10.0000 mg | ORAL_TABLET | Freq: Every day | ORAL | Status: DC

## 2015-02-17 MED ORDER — AMPHETAMINE-DEXTROAMPHET ER 20 MG PO CP24: 20.0000 mg | ORAL_CAPSULE | Freq: Every day | ORAL | Status: DC

## 2015-02-17 NOTE — Patient Instructions (Signed)
Stress and Stress Management Stress is a normal reaction to life events. It is what you feel when life demands more than you are used to or more than you can handle. Some stress can be useful. For example, the stress reaction can help you catch the last bus of the day, study for a test, or meet a deadline at work. But stress that occurs too often or for too long can cause problems. It can affect your emotional health and interfere with relationships and normal daily activities. Too much stress can weaken your immune system and increase your risk for physical illness. If you already have a medical problem, stress can make it worse. CAUSES  All sorts of life events may cause stress. An event that causes stress for one person may not be stressful for another person. Major life events commonly cause stress. These may be positive or negative. Examples include losing your job, moving into a new home, getting married, having a baby, or losing a loved one. Less obvious life events may also cause stress, especially if they occur day after day or in combination. Examples include working long hours, driving in traffic, caring for children, being in debt, or being in a difficult relationship. SIGNS AND SYMPTOMS Stress may cause emotional symptoms including, the following:  Anxiety. This is feeling worried, afraid, on edge, overwhelmed, or out of control.  Anger. This is feeling irritated or impatient.  Depression. This is feeling sad, down, helpless, or guilty.  Difficulty focusing, remembering, or making decisions. Stress may cause physical symptoms, including the following:   Aches and pains. These may affect your head, neck, back, stomach, or other areas of your body.  Tight muscles or clenched jaw.  Low energy or trouble sleeping. Stress may cause unhealthy behaviors, including the following:   Eating to feel better (overeating) or skipping meals.  Sleeping too little, too much, or both.  Working  too much or putting off tasks (procrastination).  Smoking, drinking alcohol, or using drugs to feel better. DIAGNOSIS  Stress is diagnosed through an assessment by your health care provider. Your health care provider will ask questions about your symptoms and any stressful life events.Your health care provider will also ask about your medical history and may order blood tests or other tests. Certain medical conditions and medicine can cause physical symptoms similar to stress. Mental illness can cause emotional symptoms and unhealthy behaviors similar to stress. Your health care provider may refer you to a mental health professional for further evaluation.  TREATMENT  Stress management is the recommended treatment for stress.The goals of stress management are reducing stressful life events and coping with stress in healthy ways.  Techniques for reducing stressful life events include the following:  Stress identification. Self-monitor for stress and identify what causes stress for you. These skills may help you to avoid some stressful events.  Time management. Set your priorities, keep a calendar of events, and learn to say "no." These tools can help you avoid making too many commitments. Techniques for coping with stress include the following:  Rethinking the problem. Try to think realistically about stressful events rather than ignoring them or overreacting. Try to find the positives in a stressful situation rather than focusing on the negatives.  Exercise. Physical exercise can release both physical and emotional tension. The key is to find a form of exercise you enjoy and do it regularly.  Relaxation techniques. These relax the body and mind. Examples include yoga, meditation, tai chi, biofeedback, deep  breathing, progressive muscle relaxation, listening to music, being out in nature, journaling, and other hobbies. Again, the key is to find one or more that you enjoy and can do  regularly.  Healthy lifestyle. Eat a balanced diet, get plenty of sleep, and do not smoke. Avoid using alcohol or drugs to relax.  Strong support network. Spend time with family, friends, or other people you enjoy being around.Express your feelings and talk things over with someone you trust. Counseling or talktherapy with a mental health professional may be helpful if you are having difficulty managing stress on your own. Medicine is typically not recommended for the treatment of stress.Talk to your health care provider if you think you need medicine for symptoms of stress. HOME CARE INSTRUCTIONS  Keep all follow-up visits as directed by your health care provider.  Take all medicines as directed by your health care provider. SEEK MEDICAL CARE IF:  Your symptoms get worse or you start having new symptoms.  You feel overwhelmed by your problems and can no longer manage them on your own. SEEK IMMEDIATE MEDICAL CARE IF:  You feel like hurting yourself or someone else. Document Released: 01/10/2001 Document Revised: 12/01/2013 Document Reviewed: 03/11/2013 ExitCare Patient Information 2015 [employer]. This information is not intended to replace advice given to you by your health care provider. Make sure you discuss any questions you have with your health care provider.  

## 2015-02-17 NOTE — Progress Notes (Signed)
   Subjective:    Patient ID: Tara Randall, female    DOB: October 15, 1985, 29 y.o.   MRN: [id number]  HPI  Patient in today for follow up of : -ADHD- currently on adderall XR 20mg  daily- patient is dong well- able to concentrate- Is gettong ready to go back to college- no side effects - Depression/GAD- on effexor 150 mg- Seems to work when she is not stressed- but when she is stressed it does not seem to be working. Currently under a lot of stress with death of a friend.    Review of Systems  Constitutional: Negative.   Respiratory: Negative.   Cardiovascular: Negative.   Gastrointestinal: Positive for diarrhea.  Genitourinary: Negative.   Neurological: Negative.   Psychiatric/Behavioral: Negative.   All other systems reviewed and are negative.      Objective:   Physical Exam  Constitutional: She is oriented to person, place, and time. She appears well-developed and well-nourished.  Cardiovascular: Normal rate, regular rhythm and normal heart sounds.   Pulmonary/Chest: Effort normal and breath sounds normal.  Neurological: She is alert and oriented to person, place, and time.  Skin: Skin is warm and dry.  Psychiatric: She has a normal mood and affect. Her behavior is normal. Judgment and thought content normal.   BP 122/90 mmHg  Pulse 85  Temp(Src) 98.5 F (36.9 C) (Oral)  Ht 5\' 5"  (1.651 m)  Wt 255 lb (115.667 kg)  BMI 42.43 kg/m2        Assessment & Plan:  1. GAD (generalized anxiety disorder) Stress amanegment  2. Depression Discussed weaning of effexor and changing over to lexapro - escitalopram (LEXAPRO) 10 MG tablet; Take 1 tablet (10 mg total) by mouth daily.  Dispense: 30 tablet; Refill: 5  3. ADD (attention deficit disorder) - amphetamine-dextroamphetamine (ADDERALL XR) 20 MG 24 hr capsule; Take 1 capsule (20 mg total) by mouth every morning.  Dispense: 30 capsule; Refill: 0 - amphetamine-dextroamphetamine (ADDERALL XR) 20 MG 24 hr capsule; Take 1 capsule (20  mg total) by mouth daily.  Dispense: 30 capsule; Refill: 0 - amphetamine-dextroamphetamine (ADDERALL XR) 20 MG 24 hr capsule; Take 1 capsule (20 mg total) by mouth every morning.  Dispense: 30 capsule; Refill: 0  Follow up in 3 months  Mary-Margaret Daphine DeutscherMartin, FNP

## 2015-03-22 ENCOUNTER — Ambulatory Visit (INDEPENDENT_AMBULATORY_CARE_PROVIDER_SITE_OTHER): Payer: Medicaid Other | Admitting: Nurse Practitioner

## 2015-03-22 ENCOUNTER — Encounter: Payer: Self-pay | Admitting: Nurse Practitioner

## 2015-03-22 ENCOUNTER — Ambulatory Visit (INDEPENDENT_AMBULATORY_CARE_PROVIDER_SITE_OTHER): Payer: Medicaid Other

## 2015-03-22 VITALS — BP 148/97 | HR 95 | Temp 98.2°F | Ht 65.0 in | Wt 253.0 lb

## 2015-03-22 DIAGNOSIS — F909 Attention-deficit hyperactivity disorder, unspecified type: Secondary | ICD-10-CM

## 2015-03-22 DIAGNOSIS — F411 Generalized anxiety disorder: Secondary | ICD-10-CM

## 2015-03-22 DIAGNOSIS — M25571 Pain in right ankle and joints of right foot: Secondary | ICD-10-CM

## 2015-03-22 DIAGNOSIS — F988 Other specified behavioral and emotional disorders with onset usually occurring in childhood and adolescence: Secondary | ICD-10-CM

## 2015-03-22 NOTE — Progress Notes (Signed)
   Subjective:    Patient ID: Tara Randall, female    DOB: 1986/07/28, 29 y.o.   MRN: [id number]  HPI Pt here today for follow up after changing her medication from effexor to lexapro. Pt states that she has been feeling much better and much less depressed recently. Also reports more energy and more activity with her child. Denies any medication side effects.   Pt also c/o right ankle pain after an injury in March. Reports she was running in the yard and feel in a hole. States she has been more active recently and that may have flared the pain. Using a ankle brace at home and using ibuprofen OTC. Also been using heat and ice at home.     Review of Systems  Constitutional: Negative.   HENT: Negative.   Eyes: Negative.   Respiratory: Negative.   Cardiovascular: Negative.   Gastrointestinal: Negative.   Endocrine: Negative.   Genitourinary: Negative.   Musculoskeletal: Positive for joint swelling.  Skin: Negative.   Allergic/Immunologic: Negative.   Neurological: Negative.   Hematological: Negative.   Psychiatric/Behavioral: Negative.        Objective:   Physical Exam  Constitutional: She appears well-developed and well-nourished.  Cardiovascular: Normal rate, regular rhythm, normal heart sounds and intact distal pulses.   Pulmonary/Chest: Effort normal.  Musculoskeletal: Normal range of motion.  FROM of right ankle with slight pain on plantar flexion.  Neurological: She is alert.  Skin: Skin is warm.  Psychiatric: She has a normal mood and affect. Her behavior is normal.    BP 148/97 mmHg  Pulse 95  Temp(Src) 98.2 F (36.8 C) (Oral)  Ht  (1.651 m)  Wt 253 lb (114.76 kg)  BMI 42.10 kg/m2       Assessment & Plan:  1. Right ankle pain Rest Ice  Compression wrap  Elevate when sitting - DG Ankle Complete Right; Future  2. GAD (generalized anxiety disorder) Continue stress management contiinue lexapro as describes RTO in 3 months  Mary-Margaret Daphine Deutscher,  FNP

## 2015-03-22 NOTE — Patient Instructions (Signed)
RICE: Routine Care for Injuries The routine care of many injuries includes Rest, Ice, Compression, and Elevation (RICE). HOME CARE INSTRUCTIONS  Rest is needed to allow your body to heal. Routine activities can usually be resumed when comfortable. Injured tendons and bones can take up to 6 weeks to heal. Tendons are the cord-like structures that attach muscle to bone.  Ice following an injury helps keep the swelling down and reduces pain.  Put ice in a plastic bag.  Place a towel between your skin and the bag.  Leave the ice on for 15-20 minutes, 3-4 times a day, or as directed by your health care provider. Do this while awake, for the first 24 to 48 hours. After that, continue as directed by your caregiver.  Compression helps keep swelling down. It also gives support and helps with discomfort. If an elastic bandage has been applied, it should be removed and reapplied every 3 to 4 hours. It should not be applied tightly, but firmly enough to keep swelling down. Watch fingers or toes for swelling, bluish discoloration, coldness, numbness, or excessive pain. If any of these problems occur, remove the bandage and reapply loosely. Contact your caregiver if these problems continue.  Elevation helps reduce swelling and decreases pain. With extremities, such as the arms, hands, legs, and feet, the injured area should be placed near or above the level of the heart, if possible. SEEK IMMEDIATE MEDICAL CARE IF:  You have persistent pain and swelling.  You develop redness, numbness, or unexpected weakness.  Your symptoms are getting worse rather than improving after several days. These symptoms may indicate that further evaluation or further X-rays are needed. Sometimes, X-rays may not show a small broken bone (fracture) until 1 week or 10 days later. Make a follow-up appointment with your caregiver. Ask when your X-ray results will be ready. Make sure you get your X-ray results. Document Released:  10/29/2000 Document Revised: 07/22/2013 Document Reviewed: 12/16/2010 ExitCare Patient Information 2015 [employer]. This information is not intended to replace advice given to you by your health care provider. Make sure you discuss any questions you have with your health care provider.  

## 2015-03-30 ENCOUNTER — Ambulatory Visit: Payer: Medicaid Other | Admitting: Pediatrics

## 2015-03-31 ENCOUNTER — Ambulatory Visit (INDEPENDENT_AMBULATORY_CARE_PROVIDER_SITE_OTHER): Payer: Medicaid Other | Admitting: Pediatrics

## 2015-03-31 ENCOUNTER — Encounter: Payer: Self-pay | Admitting: Pediatrics

## 2015-03-31 ENCOUNTER — Encounter (INDEPENDENT_AMBULATORY_CARE_PROVIDER_SITE_OTHER): Payer: Self-pay

## 2015-03-31 VITALS — BP 127/89 | HR 81 | Temp 98.2°F | Ht 65.0 in | Wt 252.8 lb

## 2015-03-31 DIAGNOSIS — M25571 Pain in right ankle and joints of right foot: Secondary | ICD-10-CM | POA: Diagnosis not present

## 2015-03-31 DIAGNOSIS — S8992XA Unspecified injury of left lower leg, initial encounter: Secondary | ICD-10-CM | POA: Diagnosis not present

## 2015-03-31 NOTE — Progress Notes (Signed)
   CC: Leg injury  Subjective:  Pt fell 1 week ago climbing up the ladder to get into family;s pool. L leg went through the rungs of the ladder and was caught, had significant bruising and now pt still with pain one week later. She was able to bear weight on the leg and has been able to walk on it since then. Bruising has improved and pain in leg has improved over the past week. She fell on her knee, most significant pain is in knee, also on lateral and posterior of lower leg where bruises are. Stepped hard on R ankle while catching herself, same ankle that she hurt apprx 6 months ago with a high ankle sprain.  Did not fall on arms or head, no LOC. Mechanical fall from slipping on ladder.   Review of Systems  Constitutional: Negative for fever and chills.  Eyes: Negative for blurred vision and double vision.  Cardiovascular: Negative for chest pain.  Skin: Negative for rash.  Neurological: Negative for sensory change, focal weakness and headaches.  Endo/Heme/Allergies: Does not bruise/bleed easily.  Psychiatric/Behavioral: Negative for depression. The patient is not nervous/anxious.    Past Medical History Patient Active Problem List   Diagnosis Date Noted  . Depression 01/26/2014  . GAD (generalized anxiety disorder) 01/26/2014  . ADD (attention deficit disorder) 01/26/2014    Medications- reviewed and updated Current Outpatient Prescriptions  Medication Sig Dispense Refill  . amphetamine-dextroamphetamine (ADDERALL XR) 20 MG 24 hr capsule Take 1 capsule (20 mg total) by mouth every morning. 30 capsule 0  . escitalopram (LEXAPRO) 10 MG tablet Take 1 tablet (10 mg total) by mouth daily. 30 tablet 5  . TRI-SPRINTEC 0.18/0.215/0.25 MG-35 MCG tablet TAKE ONE TABLET BY MOUTH ONCE DAILY 28 tablet 2   No current facility-administered medications for this visit.    Objective: BP 127/89 mmHg  Pulse 81  Temp(Src) 98.2 F (36.8 C) (Oral)  Ht  (1.651 m)  Wt 252 lb 12.8 oz (114.669  kg)  BMI 42.07 kg/m2 Gen: NAD, alert, cooperative with exam HEENT: NCAT, EOMI CV: NRRR, good S1/S2, no murmur. DP pulses 2+. Resp: CTABL, no wheezes, non-labored MSK: Normal ROM R ankle, normal strenght with inversion, eversion. Pain in front of ankle with stepping hard on ankle with weight bearing. L leg with yellow/blue bruising over lateral knee, posterior calf. Tender with palpation throughout leg, no point tenderness though exam limited by pain from bruising. Patella intact, no point tenderness. L Ankle ROM normal, no pain. Able to weight bear and walk with slight antalgic gait favoring L leg. Neuro: Alert and oriented, No gross deficits, no sensation changes.    Assessment/Plan:  29yoF with recent leg injury, weight bearing with bruising that is improving with slow improvement in weightbearing and mobility. Low suspicion for fracture given weight bearing status, full ROM, and lack of point tenderness. Bruising will take some time to heal.  --Wear lace up ankle brace R ankle for high ankle sprain that has been exacerbated --Continue to try to keep L elevated --ibuprofen --ice --RTC for no improvement or worsening  Rex Kras, MD [place] [facility] 03/31/2015, 7:29 PM

## 2015-04-30 ENCOUNTER — Other Ambulatory Visit: Payer: Self-pay | Admitting: Nurse Practitioner

## 2015-05-21 ENCOUNTER — Emergency Department (HOSPITAL_COMMUNITY)
Admission: EM | Admit: 2015-05-21 | Discharge: 2015-05-22 | Disposition: A | Discharge status: Other Acute Inpatient Hospital | Payer: Medicaid Other | Attending: Emergency Medicine | Admitting: Emergency Medicine

## 2015-05-21 ENCOUNTER — Ambulatory Visit: Payer: Medicaid Other | Admitting: Nurse Practitioner

## 2015-05-21 ENCOUNTER — Encounter (HOSPITAL_COMMUNITY): Payer: Self-pay | Admitting: Cardiology

## 2015-05-21 ENCOUNTER — Encounter: Payer: Self-pay | Admitting: Pediatrics

## 2015-05-21 ENCOUNTER — Ambulatory Visit (INDEPENDENT_AMBULATORY_CARE_PROVIDER_SITE_OTHER): Payer: Medicaid Other | Admitting: Pediatrics

## 2015-05-21 VITALS — BP 125/87 | HR 92 | Temp 97.9°F | Ht 65.0 in | Wt 242.0 lb

## 2015-05-21 DIAGNOSIS — R45851 Suicidal ideations: Secondary | ICD-10-CM

## 2015-05-21 DIAGNOSIS — Z91040 Latex allergy status: Secondary | ICD-10-CM | POA: Insufficient documentation

## 2015-05-21 DIAGNOSIS — F1210 Cannabis abuse, uncomplicated: Secondary | ICD-10-CM | POA: Insufficient documentation

## 2015-05-21 DIAGNOSIS — F411 Generalized anxiety disorder: Secondary | ICD-10-CM

## 2015-05-21 DIAGNOSIS — F32A Depression, unspecified: Secondary | ICD-10-CM

## 2015-05-21 DIAGNOSIS — N898 Other specified noninflammatory disorders of vagina: Secondary | ICD-10-CM

## 2015-05-21 DIAGNOSIS — Z79899 Other long term (current) drug therapy: Secondary | ICD-10-CM | POA: Diagnosis not present

## 2015-05-21 DIAGNOSIS — F329 Major depressive disorder, single episode, unspecified: Secondary | ICD-10-CM | POA: Diagnosis not present

## 2015-05-21 DIAGNOSIS — F1510 Other stimulant abuse, uncomplicated: Secondary | ICD-10-CM | POA: Diagnosis not present

## 2015-05-21 DIAGNOSIS — B9689 Other specified bacterial agents as the cause of diseases classified elsewhere: Secondary | ICD-10-CM

## 2015-05-21 DIAGNOSIS — N760 Acute vaginitis: Secondary | ICD-10-CM | POA: Insufficient documentation

## 2015-05-21 DIAGNOSIS — Z87891 Personal history of nicotine dependence: Secondary | ICD-10-CM | POA: Insufficient documentation

## 2015-05-21 LAB — COMPREHENSIVE METABOLIC PANEL
ALK PHOS: 51 U/L (ref 38–126)
ALT: 41 U/L (ref 14–54)
ANION GAP: 11 (ref 5–15)
AST: 30 U/L (ref 15–41)
Albumin: 3.6 g/dL (ref 3.5–5.0)
BILIRUBIN TOTAL: 0.3 mg/dL (ref 0.3–1.2)
BUN: 10 mg/dL (ref 6–20)
CALCIUM: 9.8 mg/dL (ref 8.9–10.3)
CO2: 24 mmol/L (ref 22–32)
Chloride: 103 mmol/L (ref 101–111)
Creatinine, Ser: 0.95 mg/dL (ref 0.44–1.00)
GFR calc non Af Amer: >60 mL/min (ref >60)
Glucose, Bld: 85 mg/dL (ref 65–99)
Potassium: 4.1 mmol/L (ref 3.5–5.1)
SODIUM: 138 mmol/L (ref 135–145)
TOTAL PROTEIN: 7 g/dL (ref 6.5–8.1)

## 2015-05-21 LAB — SALICYLATE LEVEL

## 2015-05-21 LAB — RAPID URINE DRUG SCREEN, HOSP PERFORMED
Amphetamines: POSITIVE — AB
Barbiturates: NOT DETECTED
Benzodiazepines: NOT DETECTED
COCAINE: NOT DETECTED
OPIATES: NOT DETECTED
Tetrahydrocannabinol: POSITIVE — AB

## 2015-05-21 LAB — POC URINE PREG, ED: Preg Test, Ur: NEGATIVE

## 2015-05-21 LAB — CBC
HCT: 40.2 % (ref 36.0–46.0)
Hemoglobin: 13.4 g/dL (ref 12.0–15.0)
MCH: 32.1 pg (ref 26.0–34.0)
MCHC: 33.3 g/dL (ref 30.0–36.0)
MCV: 96.4 fL (ref 78.0–100.0)
Platelets: 249 10*3/uL (ref 150–400)
RBC: 4.17 MIL/uL (ref 3.87–5.11)
RDW: 13.3 % (ref 11.5–15.5)
WBC: 8.3 10*3/uL (ref 4.0–10.5)

## 2015-05-21 LAB — WET PREP, GENITAL
CLUE CELLS WET PREP: NONE SEEN
TRICH WET PREP: NONE SEEN
Yeast Wet Prep HPF POC: NONE SEEN

## 2015-05-21 LAB — ACETAMINOPHEN LEVEL

## 2015-05-21 LAB — ETHANOL: Alcohol, Ethyl (B): <5 mg/dL (ref <5)

## 2015-05-21 MED ORDER — METRONIDAZOLE 500 MG PO TABS
500.0000 mg | ORAL_TABLET | Freq: Two times a day (BID) | ORAL | Status: DC
  Administered 2015-05-21: 500 mg via ORAL
  Filled 2015-05-21: qty 1

## 2015-05-21 MED ORDER — ACETAMINOPHEN 325 MG PO TABS: 650.0000 mg | ORAL_TABLET | ORAL | Status: DC | PRN

## 2015-05-21 MED ORDER — NORGESTIM-ETH ESTRAD TRIPHASIC 0.18/0.215/0.25 MG-35 MCG PO TABS: 1.0000 | ORAL_TABLET | Freq: Every day | ORAL | Status: DC

## 2015-05-21 MED ORDER — IBUPROFEN 400 MG PO TABS: 600.0000 mg | ORAL_TABLET | Freq: Three times a day (TID) | ORAL | Status: DC | PRN

## 2015-05-21 MED ORDER — ESCITALOPRAM OXALATE 10 MG PO TABS: 10.0000 mg | ORAL_TABLET | Freq: Every day | ORAL | Status: DC

## 2015-05-21 MED ORDER — AMPHETAMINE-DEXTROAMPHET ER 10 MG PO CP24: 20.0000 mg | ORAL_CAPSULE | ORAL | Status: DC

## 2015-05-21 MED ORDER — FLUCONAZOLE 100 MG PO TABS
150.0000 mg | ORAL_TABLET | Freq: Once | ORAL | Status: AC
  Administered 2015-05-21: 150 mg via ORAL
  Filled 2015-05-21: qty 2

## 2015-05-21 MED ORDER — METRONIDAZOLE 500 MG PO TABS: 500.0000 mg | ORAL_TABLET | Freq: Two times a day (BID) | ORAL | Status: DC

## 2015-05-21 MED ORDER — ONDANSETRON HCL 4 MG PO TABS: 4.0000 mg | ORAL_TABLET | Freq: Three times a day (TID) | ORAL | Status: DC | PRN

## 2015-05-21 MED ORDER — ALUM & MAG HYDROXIDE-SIMETH 200-200-20 MG/5ML PO SUSP: 30.0000 mL | ORAL | Status: DC | PRN

## 2015-05-21 MED ORDER — LORAZEPAM 1 MG PO TABS: 1.0000 mg | ORAL_TABLET | Freq: Three times a day (TID) | ORAL | Status: DC | PRN

## 2015-05-21 MED ORDER — ZOLPIDEM TARTRATE 5 MG PO TABS
5.0000 mg | ORAL_TABLET | Freq: Every evening | ORAL | Status: DC | PRN
  Administered 2015-05-21: 5 mg via ORAL
  Filled 2015-05-21: qty 1

## 2015-05-21 NOTE — ED Notes (Signed)
Father calls, speaks with patient directly for update.

## 2015-05-21 NOTE — BH Assessment (Signed)
[facility] Assessment Progress Note   Called and scheduled pt's tele assessment with this clinician as well as gathered clinical information from Levi StraussMercedes Camprubi-Soms, PA-C.  Casimer LaniusKristen Gabrial Domine, MS, [facility] Therapeutic Triage Specialist [facility]

## 2015-05-21 NOTE — Progress Notes (Addendum)
Subjective:    Patient ID: Tara Randall, female    DOB: June 30, 1986, 29 y.o.   MRN: [id number]  CC: depression  HPI: Tara Randall is a 29 y.o. female presenting on 05/21/2015 for Med check and Depression  Pt says she has not been doing well at all. Problems with landlord, friends have been standing her up, failing her class in college, feels like everything is building up at home. For the past week she has been thinking about "blowing her face off" with the gun that the family has at home. She has had multiple prior suicidal attempts, usually with overdosing on a variety of medicines. She has ibuprofen and tylenol at home. Last SA was years ago, she says she has not felt as depressed and wanting to end her life now for over 10 years. She was last hospitalized at age 29yo for suicidal attempt that she was told was her attempt to get attention. She lives with her dad Tara Randall, two younger brothers, and 56mo son Tara Randall. She has been caring for her son adequately. She feels her father has also been caring well for Tara Randall since he was born.   She feels the lexapro is not working, she had been on a different SSRI before, she switched because that wasn't working either.  Relevant past medical, surgical, family and social history reviewed and updated as indicated. Interim medical history since our last visit reviewed. Allergies and medications reviewed and updated.   ROS: Per HPI unless specifically indicated above  Past Medical History Patient Active Problem List   Diagnosis Date Noted  . Depression 01/26/2014  . GAD (generalized anxiety disorder) 01/26/2014  . ADD (attention deficit disorder) 01/26/2014    Current Outpatient Prescriptions  Medication Sig Dispense Refill  . amphetamine-dextroamphetamine (ADDERALL XR) 20 MG 24 hr capsule Take 1 capsule (20 mg total) by mouth every morning. 30 capsule 0  . escitalopram (LEXAPRO) 10 MG tablet Take 1 tablet (10 mg total) by mouth daily. 30 tablet  5  . TRI-SPRINTEC 0.18/0.215/0.25 MG-35 MCG tablet TAKE ONE TABLET BY MOUTH ONCE DAILY 28 tablet 2  . TRI-SPRINTEC 0.18/0.215/0.25 MG-35 MCG tablet TAKE ONE TABLET BY MOUTH ONCE DAILY 28 tablet 2  . venlafaxine XR (EFFEXOR-XR) 150 MG 24 hr capsule TAKE ONE CAPSULE BY MOUTH ONCE DAILY WITH  BREAKFAST (Patient not taking: Reported on 05/21/2015) 30 capsule 2   No current facility-administered medications for this visit.       Objective:    BP 125/87 mmHg  Pulse 92  Temp(Src) 97.9 F (36.6 C) (Oral)  Ht  (1.651 m)  Wt 242 lb (109.77 kg)  BMI 40.27 kg/m2  Wt Readings from Last 3 Encounters:  05/21/15 242 lb (109.77 kg)  03/31/15 252 lb 12.8 oz (114.669 kg)  03/22/15 253 lb (114.76 kg)    Gen: NAD, alert, cooperative with exam, tearful EYES: EOMI, no scleral injection or icterus CV: WWP Resp: Nl WOB Ext: No edema, warm Neuro: Alert and oriented, strength equal b/l UE and LE, coordination grossly normal Psych: Tearful, active thoughts of suicide, no HI, normal stream of thoughts     Assessment & Plan:   Kimesha was seen today for med check and depression. She is actively suicidal. She is here alone, her father is watching her son. Givne her active suicidal thoughts with a plan that involves a gun at home I do not believe that she is safe to go home. Discussed with patient and she agreed that she has  not felt like this or had thoughts this bad in a long time. I want her to go to the emergency room for further evlauation. She has the family's only car. She has no family member or friend who can drive her there. I will call an ambulance for transport for further behavior health evaluation.  [name] says her father is very capable of taking care of her son. He has never done anything to hurt her, her son or anyone else that she knows of.   Diagnoses and all orders for this visit:  Suicidal ideation  Follow up plan: As needed  Rex Krasarol Deem Marmol, MD [facility] 05/21/2015, 3:17 PM

## 2015-05-21 NOTE — ED Notes (Signed)
Patient is eating meal from dietary.

## 2015-05-21 NOTE — ED Notes (Signed)
[facility]- pt reports she recently had a change in her medications from effexer to lexapro and has not been feeling like herself since then. Pt reports SI over the past couple of days, but does not verbalize a plan. Pt is tearful but cooperative at triage.

## 2015-05-21 NOTE — ED Notes (Signed)
Wanded by security 

## 2015-05-21 NOTE — ED Notes (Signed)
Dr. Effie Shywentz in to see the patient.

## 2015-05-21 NOTE — Patient Instructions (Signed)
24 hour a day CRISIS NUMBER: [phone]   List of local counseling services:  [facility] [address] [place], [place] [phone] Does see children Does accept medicaid Will assess for Autism but not treat  [facility] [address] [place] [phone] Does see children  Does accept Medicaid Medication management- substance abuse- bipolar- grief- family-marriage- OCD- Anxiety- PTSD  [facility] [address] [place]  [phone] Does see children Does accept medicaid They do perform psychological testing  [facility] [address] [place] Schedule through [employer] [phone] Patient must call and make own appointment Does se children Does accept Medicaid  [facility] [address] [place], [place] [phone] Sees Children 7-10 accompanied by an adult, 11 and up by themselves Does accept Medicaid Will see patients with- substance abuse-ADHD-ADD-Bipolar-Domestic violence-Marriage counseling- Family Counseling and sexual abuse  [facility] Psychologist and Psychiatrist [address] [place] [phone] Does see children Does accept [facility]  [facility] [address] [place] [phone]  Dr. Dub MikesLugo-  Psychiatrist [address] [place], [place] [phone] Specializes in ADHD and addictions They do ADHD testing Suboxone clinic  [facility] [address] [place] [phone] Does Child psychological testing  [facility] [address] [place] [phone] Does Accept Medicaid Evaluates for Autism  [facility] [address] [place], [place] [address] [phone] Takes Medicaid WIll see children as young as 3

## 2015-05-21 NOTE — Progress Notes (Addendum)
[facility] to Dr. Jama Flavorsobos, to room 407-1, arrival time - when patient is ready. Call report at [phone]. RN Tamica informed.  [place] Abtsatia Tee Richeson, LCSWA Disposition staff 05/21/2015 10:55 PM
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
## 2015-05-21 NOTE — ED Notes (Signed)
Called [facility] for transport, patient 2nd on the list, no ETA at this point. Dr. Effie ShyWentz made aware.

## 2015-05-21 NOTE — ED Notes (Signed)
Staffing office called for sitter. Pt given paper scrubs.

## 2015-05-21 NOTE — ED Provider Notes (Signed)
CSN: [id number]     Arrival date & time 05/21/15  1606 History  By signing my name below, I, Emmanuella Mensah, attest that this documentation has been prepared under the direction and in the presence of Elai Vanwyk Camprubi-Soms, PA. Electronically Signed: Angelene Giovanni, ED Scribe. 05/21/2015. 5:21 PM.    Chief Complaint  Patient presents with  . Suicidal   Patient is a 29 y.o. female presenting with depression. The history is provided by the patient. No language interpreter was used.  Depression This is a recurrent problem. The current episode started more than 1 week ago. The problem occurs constantly. The problem has not changed since onset.Pertinent negatives include no chest pain, no abdominal pain, no headaches and no shortness of breath. The symptoms are aggravated by stress. Nothing relieves the symptoms. Treatments tried: Lexapro. The treatment provided no relief.   HPI Comments: Tara Randall is a 29 y.o. female with a PMHx of depression and ADD, who presents to the Emergency Department voluntarily complaining of depression and SI onset one week ago. She reports that she is currently on Lexapro and her last dose was at 10 am today. She adds that she recently changed her medication from Effexor. Per chart review from [facility] Medicine appt today, "Pt says she has not been doing well at all. Problems with landlord, friends have been standing her up, failing her class in college, feels like everything is building up at home. For the past week she has been thinking about "blowing her face off" with the gun that the family has at home. She has had multiple prior suicidal attempts, usually with overdosing on a variety of medicines. She has ibuprofen and tylenol at home. Last SA was years ago, she says she has not felt as depressed and wanting to end her life now for over 10 years. She was last hospitalized at age 85yo for suicidal attempt that she was told was her attempt to get  attention. She lives with her dad Tara Randall, two younger brothers, and 30mo son Tara Randall. She has been caring for her son adequately". She continues to state she feels suicidal with a plan to shoot herself in the head. She denies HI/AVH.   She also reports thick white vaginal discharge with mild vaginal itching onset a week and a half ago. She denies any fever, chills, CP, SOB, abdominal pain, N/V/D/C, dysuria, hematuria, genial lesions, vaginal bleeding, numbness, tingling, or weakness. She states that she has been sexually active with approximately  2 partners in the past year, sometimes unprotected. She reports that she smoked Marijuana and had one beer yesterday, but denies any other illicit drug use or alcohol use. Denies being a smoker.   History reviewed. No pertinent past medical history. Past Surgical History  Procedure Laterality Date  . Appendectomy     Family History  Problem Relation Age of Onset  . Depression Mother     Bi polar,manic  . Heart disease Father   . Depression Father   . ADD / ADHD Brother    Social History  Substance Use Topics  . Smoking status: Former Games developer  . Smokeless tobacco: None  . Alcohol Use: No   OB History    No data available     Review of Systems  Constitutional: Negative for fever and chills.  Respiratory: Negative for shortness of breath.   Cardiovascular: Negative for chest pain.  Gastrointestinal: Negative for nausea, vomiting, abdominal pain, diarrhea and constipation.  Genitourinary: Positive for vaginal discharge. Negative  for dysuria, hematuria, vaginal bleeding, genital sores and vaginal pain.       +vaginal itching  Musculoskeletal: Negative for myalgias.  Skin: Negative for rash.  Allergic/Immunologic: Negative for immunocompromised state.  Neurological: Negative for weakness, numbness and headaches.  Psychiatric/Behavioral: Positive for depression and suicidal ideas. Negative for hallucinations.   10 Systems reviewed and all are  negative for acute change except as noted in the HPI.      Allergies  Latex  Home Medications   Prior to Admission medications   Medication Sig Start Date End Date Taking? Authorizing Provider  amphetamine-dextroamphetamine (ADDERALL XR) 20 MG 24 hr capsule Take 1 capsule (20 mg total) by mouth every morning. 02/17/15   Mary-Margaret Daphine Deutscher, FNP  escitalopram (LEXAPRO) 10 MG tablet Take 1 tablet (10 mg total) by mouth daily. 02/17/15   Mary-Margaret Daphine Deutscher, FNP  TRI-SPRINTEC 0.18/0.215/0.25 MG-35 MCG tablet TAKE ONE TABLET BY MOUTH ONCE DAILY 12/22/14   Mary-Margaret Daphine Deutscher, FNP  TRI-SPRINTEC 0.18/0.215/0.25 MG-35 MCG tablet TAKE ONE TABLET BY MOUTH ONCE DAILY 05/02/15   Mary-Margaret Daphine Deutscher, FNP  venlafaxine XR (EFFEXOR-XR) 150 MG 24 hr capsule TAKE ONE CAPSULE BY MOUTH ONCE DAILY WITH  BREAKFAST Patient not taking: Reported on 05/21/2015 05/02/15   Mary-Margaret Daphine Deutscher, FNP   BP 118/84 mmHg  Pulse 79  Temp(Src) 98.2 F (36.8 C) (Oral)  Resp 16  Ht 5\' 5"  (1.651 m)  Wt 242 lb (109.77 kg)  BMI 40.27 kg/m2  SpO2 98% Physical Exam  Constitutional: She is oriented to person, place, and time. Vital signs are normal. She appears well-developed and well-nourished.  Non-toxic appearance.  Afebrile, nontoxic  HENT:  Head: Normocephalic and atraumatic.  Mouth/Throat: Oropharynx is clear and moist and mucous membranes are normal.  Eyes: Conjunctivae and EOM are normal. Right eye exhibits no discharge. Left eye exhibits no discharge.  Neck: Normal range of motion. Neck supple.  Cardiovascular: Normal rate, regular rhythm, normal heart sounds and intact distal pulses.  Exam reveals no gallop and no friction rub.   No murmur heard. Pulmonary/Chest: Effort normal and breath sounds normal. No respiratory distress. She has no decreased breath sounds. She has no wheezes. She has no rhonchi. She has no rales.  Abdominal: Soft. Normal appearance and bowel sounds are normal. She exhibits no distension.  There is no tenderness. There is no rigidity, no rebound, no guarding, no CVA tenderness, no tenderness at McBurney's point and negative Murphy's sign.  Genitourinary: Uterus normal. Pelvic exam was performed with patient supine. There is no rash, tenderness or lesion on the right labia. There is no rash, tenderness or lesion on the left labia. Cervix exhibits no motion tenderness, no discharge and no friability. Right adnexum displays no mass, no tenderness and no fullness. Left adnexum displays no mass, no tenderness and no fullness. No erythema or tenderness in the vagina. Vaginal discharge found.  Chaperone present for exam. No rashes, lesions, or tenderness to external genitalia. No erythema, injury, or tenderness to vaginal mucosa. Thick white vaginal discharge adhered to vaginal vault, without bleeding within vaginal vault. No  adnexal masses, tenderness, or fullness. No CMT, cervical friability, or discharge from cervical os. Uterus non-deviated, mobile, nonTTP, and without enlargement.    Musculoskeletal: Normal range of motion.  Neurological: She is alert and oriented to person, place, and time. She has normal strength. No sensory deficit.  Skin: Skin is warm, dry and intact. No rash noted.  Psychiatric: She is not actively hallucinating. She exhibits a depressed mood. She expresses suicidal  ideation. She expresses no homicidal ideation. She expresses suicidal plans. She expresses no homicidal plans.  Tearful and with depressed affect, endorsing SI with a plan, no HI/AVH.   Nursing note and vitals reviewed.   ED Course  Procedures (including critical care time) DIAGNOSTIC STUDIES: Oxygen Saturation is 98% on RA, normal by my interpretation.    COORDINATION OF CARE: 5:15 PM- Pt advised of plan for treatment and pt agrees. Pt reassured that she made the right decision coming today. Pt will receive a Pelvic examination.    Labs Review Labs Reviewed  WET PREP, GENITAL - Abnormal; Notable  for the following:    WBC, Wet Prep HPF POC MODERATE (*)    All other components within normal limits  ACETAMINOPHEN LEVEL - Abnormal; Notable for the following:    Acetaminophen (Tylenol), Serum <10 (*)    All other components within normal limits  URINE RAPID DRUG SCREEN, HOSP PERFORMED - Abnormal; Notable for the following:    Amphetamines POSITIVE (*)    Tetrahydrocannabinol POSITIVE (*)    All other components within normal limits  COMPREHENSIVE METABOLIC PANEL  ETHANOL  SALICYLATE LEVEL  CBC  RPR  HIV ANTIBODY (ROUTINE TESTING)  POC URINE PREG, ED  GC/CHLAMYDIA PROBE AMP ([place]) NOT AT [facility]   Nevin Kozuch Camprubi-Soms, PA has personally reviewed and evaluated these lab results as part of her medical decision-making.   EKG Interpretation None      MDM   Final diagnoses:  Suicidal ideation  Depression  BV (bacterial vaginosis)  Vaginal discharge    29 y.o. female here with SI with a plan to shoot herself in the head, has a gun at home. No HI/AVH. Tearful on exam. Also has vaginal discharge, on exam thick white discharge in vaginal vault but no cervicitis, doubt need for empiric tx of GC/CT. STD check performed. Will await wet prep but will likely still treat for yeast given the clinical symptoms. Psych hold orders placed, home meds ordered. TTS consulted. Upreg neg, UDS with +amphetamines (pt on adderall) and +THC, other med clearance labs in process. Will reassess shortly.   6:29 PM Wet prep showing moderate clue cells, will treat for BV. Will place orders in the system for this treatment, but will need script to go home with as well, will print this now and place with her belongings. Will also give diflucan given clinical symptoms of yeast. CBC and CMP WNL, ethanol/salicylate/acetaminophen still pending. Will reassess shortly.   6:35 PM EtOH, salicylate, and acetaminophen levels WNL. Pt medically cleared, all orders placed. [facility]  documentation of care.   I personally performed the services described in this documentation, which was scribed in my presence. The recorded information has been reviewed and is accurate.  BP 118/84 mmHg  Pulse 79  Temp(Src) 98.2 F (36.8 C) (Oral)  Resp 16  Ht 5\' 5"  (1.651 m)  Wt 242 lb (109.77 kg)  BMI 40.27 kg/m2  SpO2 98%  Meds ordered this encounter  Medications  . amphetamine-dextroamphetamine (ADDERALL XR) 24 hr capsule 20 mg    Sig:   . escitalopram (LEXAPRO) tablet 10 mg    Sig:   . Norgestimate-Ethinyl Estradiol Triphasic 0.18/0.215/0.25 MG-35 MCG tablet 1 tablet    Sig:   . alum & mag hydroxide-simeth (MAALOX/MYLANTA) 200-200-20 MG/5ML suspension 30 mL    Sig:   . ondansetron (ZOFRAN) tablet 4 mg    Sig:   . zolpidem (AMBIEN) tablet 5 mg    Sig:   .  ibuprofen (ADVIL,MOTRIN) tablet 600 mg    Sig:   . acetaminophen (TYLENOL) tablet 650 mg    Sig:   . LORazepam (ATIVAN) tablet 1 mg    Sig:   . fluconazole (DIFLUCAN) tablet 150 mg    Sig:   . metroNIDAZOLE (FLAGYL) tablet 500 mg    Sig:   . metroNIDAZOLE (FLAGYL) 500 MG tablet    Sig: Take 1 tablet (500 mg total) by mouth 2 (two) times daily. One po bid x 7 days    Dispense:  14 tablet    Refill:  0    Order Specific Question:  Supervising Provider    Answer:  Eber Hong [[id number]]     Elenora Hawbaker Camprubi-Soms, PA-C 05/21/15 1835  Donnetta Hutching, MD 05/21/15 [phone]

## 2015-05-22 ENCOUNTER — Encounter (HOSPITAL_COMMUNITY): Payer: Self-pay

## 2015-05-22 ENCOUNTER — Inpatient Hospital Stay (HOSPITAL_COMMUNITY)
Admission: AD | Admit: 2015-05-22 | Discharge: 2015-05-27 | DRG: 885 | Disposition: A | Discharge status: Home / Self Care | Payer: Medicaid Other | Source: Intra-hospital | Attending: Psychiatry | Admitting: Psychiatry

## 2015-05-22 DIAGNOSIS — Z8249 Family history of ischemic heart disease and other diseases of the circulatory system: Secondary | ICD-10-CM

## 2015-05-22 DIAGNOSIS — F333 Major depressive disorder, recurrent, severe with psychotic symptoms: Principal | ICD-10-CM | POA: Diagnosis present

## 2015-05-22 DIAGNOSIS — Z87891 Personal history of nicotine dependence: Secondary | ICD-10-CM

## 2015-05-22 DIAGNOSIS — Z818 Family history of other mental and behavioral disorders: Secondary | ICD-10-CM | POA: Diagnosis not present

## 2015-05-22 DIAGNOSIS — F1210 Cannabis abuse, uncomplicated: Secondary | ICD-10-CM | POA: Diagnosis not present

## 2015-05-22 DIAGNOSIS — R45851 Suicidal ideations: Secondary | ICD-10-CM | POA: Diagnosis present

## 2015-05-22 DIAGNOSIS — F988 Other specified behavioral and emotional disorders with onset usually occurring in childhood and adolescence: Secondary | ICD-10-CM

## 2015-05-22 DIAGNOSIS — F909 Attention-deficit hyperactivity disorder, unspecified type: Secondary | ICD-10-CM | POA: Diagnosis present

## 2015-05-22 DIAGNOSIS — G4700 Insomnia, unspecified: Secondary | ICD-10-CM | POA: Diagnosis present

## 2015-05-22 DIAGNOSIS — F411 Generalized anxiety disorder: Secondary | ICD-10-CM | POA: Diagnosis present

## 2015-05-22 LAB — HIV ANTIBODY (ROUTINE TESTING W REFLEX): HIV Screen 4th Generation wRfx: NONREACTIVE

## 2015-05-22 LAB — RPR: RPR Ser Ql: NONREACTIVE

## 2015-05-22 MED ORDER — FAMOTIDINE 20 MG PO TABS
20.0000 mg | ORAL_TABLET | Freq: Every day | ORAL | Status: DC
Start: 2015-05-22 — End: 2015-05-27
  Administered 2015-05-22 – 2015-05-27 (×6): 20 mg via ORAL
  Filled 2015-05-22 (×9): qty 1

## 2015-05-22 MED ORDER — ACETAMINOPHEN 325 MG PO TABS
650.0000 mg | ORAL_TABLET | Freq: Four times a day (QID) | ORAL | Status: DC | PRN
  Administered 2015-05-22 – 2015-05-27 (×5): 650 mg via ORAL
  Filled 2015-05-22 (×5): qty 2

## 2015-05-22 MED ORDER — FLUOXETINE HCL 10 MG PO CAPS
10.0000 mg | ORAL_CAPSULE | Freq: Every day | ORAL | Status: DC
  Administered 2015-05-22 – 2015-05-24 (×3): 10 mg via ORAL
  Filled 2015-05-22 (×5): qty 1

## 2015-05-22 MED ORDER — ALUM & MAG HYDROXIDE-SIMETH 200-200-20 MG/5ML PO SUSP
30.0000 mL | ORAL | Status: DC | PRN
  Administered 2015-05-22 – 2015-05-25 (×4): 30 mL via ORAL
  Filled 2015-05-22 (×4): qty 30

## 2015-05-22 MED ORDER — AMPHETAMINE-DEXTROAMPHET ER 10 MG PO CP24
20.0000 mg | ORAL_CAPSULE | ORAL | Status: DC
  Administered 2015-05-22: 20 mg via ORAL
  Filled 2015-05-22: qty 2

## 2015-05-22 MED ORDER — TRAZODONE HCL 50 MG PO TABS
50.0000 mg | ORAL_TABLET | Freq: Every evening | ORAL | Status: DC | PRN
  Administered 2015-05-22 – 2015-05-25 (×3): 50 mg via ORAL
  Filled 2015-05-22 (×4): qty 1

## 2015-05-22 MED ORDER — ESCITALOPRAM OXALATE 10 MG PO TABS
10.0000 mg | ORAL_TABLET | Freq: Every day | ORAL | Status: DC
  Administered 2015-05-22: 10 mg via ORAL
  Filled 2015-05-22 (×3): qty 1

## 2015-05-22 MED ORDER — LAMOTRIGINE 25 MG PO TABS
25.0000 mg | ORAL_TABLET | Freq: Every day | ORAL | Status: DC
  Administered 2015-05-22 – 2015-05-27 (×6): 25 mg via ORAL
  Filled 2015-05-22 (×10): qty 1

## 2015-05-22 MED ORDER — VENLAFAXINE HCL ER 150 MG PO CP24
150.0000 mg | ORAL_CAPSULE | Freq: Every day | ORAL | Status: DC
  Filled 2015-05-22: qty 1

## 2015-05-22 MED ORDER — HYDROXYZINE HCL 25 MG PO TABS
25.0000 mg | ORAL_TABLET | Freq: Three times a day (TID) | ORAL | Status: DC | PRN
  Administered 2015-05-22 – 2015-05-23 (×2): 25 mg via ORAL
  Filled 2015-05-22 (×2): qty 1

## 2015-05-22 MED ORDER — METRONIDAZOLE 500 MG PO TABS
500.0000 mg | ORAL_TABLET | Freq: Two times a day (BID) | ORAL | Status: DC
  Administered 2015-05-22 – 2015-05-27 (×11): 500 mg via ORAL
  Filled 2015-05-22 (×2): qty 1
  Filled 2015-05-22: qty 2
  Filled 2015-05-22 (×11): qty 1
  Filled 2015-05-22: qty 2
  Filled 2015-05-22 (×2): qty 1

## 2015-05-22 MED ORDER — NORGESTIM-ETH ESTRAD TRIPHASIC 0.18/0.215/0.25 MG-35 MCG PO TABS
1.0000 | ORAL_TABLET | Freq: Every day | ORAL | Status: DC
  Administered 2015-05-22 – 2015-05-27 (×6): 1 via ORAL

## 2015-05-22 MED ORDER — MAGNESIUM HYDROXIDE 400 MG/5ML PO SUSP: 30.0000 mL | Freq: Every day | ORAL | Status: DC | PRN

## 2015-05-22 NOTE — H&P (Signed)
Psychiatric Admission Assessment Adult  Patient Identification: Tara Randall MRN:  [id number] Date of Evaluation:  05/22/2015 Chief Complaint:  MDD,REC,SEV Principal Diagnosis: Major depressive disorder, recurrent episode, severe, with psychotic behavior ([place]) Diagnosis:   Patient Active Problem List   Diagnosis Date Noted  . Major depressive disorder, recurrent episode, severe, with psychotic behavior ([place]) [F33.3] 05/22/2015  . Depression [F32.9] 01/26/2014  . GAD (generalized anxiety disorder) [F41.1] 01/26/2014  . ADD (attention deficit disorder) [F90.9] 01/26/2014   History of Present Illness:: Tara Randall is an 29 y.o. female that was referred to [facility] by her physician at [place] for SI. Pt reports she has SI with a plan to "blow my face off with a gun." Pt stated the doctor was going to call pt's father and have gun removed from the home. Pt stated she has had worsening depression with SI for one week. She stated her meds were recently changed from Effexor to Lexapro and that the medication is not working. Pt stated she has long history of depression and has been hospitalized at [facility] at age 28 for suicide attempt, but reports she has had multiple attempts as a child about 6-7x. Pt has seen multiple providers for therapy and med mgnt. She currently gets her medication from PCP at [place]. Pt denies HI or AVH. No delusions noted. Pt denies drug or alcohol use, but admits she was upset last night and smoked marijuana and had one beer. Pt has a 64 month old son and her father helps her care for him. Pt reports multiple stressors like finances, having a child with no support from father's child, being in college and failing the one class she is in, and dealing with her mother leaving her entire family 3 years ago.  Associated Signs/Symptoms: Depression Symptoms:  depressed mood, anhedonia, insomnia, fatigue, feelings of  worthlessness/guilt, difficulty concentrating, suicidal thoughts with specific plan, disturbed sleep, weight gain, increased appetite, (Hypo) Manic Symptoms:   Anxiety Symptoms:  Excessive Worry, Psychotic Symptoms:   PTSD Symptoms: Memories recrculating of what has happened.  Total Time spent with patient: 45 minutes  Past Psychiatric History: MDD, GAD, and ADD  Risk to Self: Is patient at risk for suicide?: Yes Risk to Others:   Prior Inpatient Therapy: [facility] admission at age of 27 Prior Outpatient Therapy: went to a facility in [place](unable to recall the name)  Alcohol Screening: 1. How often do you have a drink containing alcohol?: Monthly or less 2. How many drinks containing alcohol do you have on a typical day when you are drinking?: 1 or 2 3. How often do you have six or more drinks on one occasion?: Never Preliminary Score: 0 9. Have you or someone else been injured as a result of your drinking?: No 10. Has a relative or friend or a doctor or another health worker been concerned about your drinking or suggested you cut down?: No Alcohol Use Disorder Identification Test Final Score (AUDIT): 1 Brief Intervention: AUDIT score less than 7 or less-screening does not suggest unhealthy drinking-brief intervention not indicated Substance Abuse History in the last 12 months:  Yes.   Consequences of Substance Abuse: Medical Consequences:  Liver damage, Possible death by overdose Legal Consequences:  Arrests, jail time, Loss of driving privilege. Family Consequences:  Family discord, divorce and or separation. Previous Psychotropic Medications: lexapro Lamitical Psychological Evaluations: Yes Past Medical History: History reviewed. No pertinent past medical history.  Past Surgical History  Procedure Laterality Date  .  Appendectomy     Family History:  Family History  Problem Relation Age of Onset  . Depression Mother     Bi polar,manic  . Heart disease Father   . Depression  Father   . ADD / ADHD Brother    Family Psychiatric  History: Mother-manic Bipolar Father Depression Social History: She is unemployed, and taking some college courses. She is studying to ger her Associates degree. She has (37) 30 year old son. No military history.  History  Alcohol Use No     History  Drug Use No    Social History   Social History  . Marital Status: Single    Spouse Name: N/A  . Number of Children: N/A  . Years of Education: N/A   Social History Main Topics  . Smoking status: Former Research scientist (life sciences)  . Smokeless tobacco: None  . Alcohol Use: No  . Drug Use: No  . Sexual Activity: Yes    Birth Control/ Protection: Pill   Other Topics Concern  . None   Social History Narrative   Additional Social History:    Allergies:   Allergies  Allergen Reactions  . Latex Rash   Lab Results:  Results for orders placed or performed during the hospital encounter of 05/21/15 (from the past 48 hour(s))  Comprehensive metabolic panel     Status: None   Collection Time: 05/21/15  4:25 PM  Result Value Ref Range   Sodium 138 135 - 145 mmol/L   Potassium 4.1 3.5 - 5.1 mmol/L   Chloride 103 101 - 111 mmol/L   CO2 24 22 - 32 mmol/L   Glucose, Bld 85 65 - 99 mg/dL   BUN 10 6 - 20 mg/dL   Creatinine, Ser 0.95 0.44 - 1.00 mg/dL   Calcium 9.8 8.9 - 10.3 mg/dL   Total Protein 7.0 6.5 - 8.1 g/dL   Albumin 3.6 3.5 - 5.0 g/dL   AST 30 15 - 41 U/L   ALT 41 14 - 54 U/L   Alkaline Phosphatase 51 38 - 126 U/L   Total Bilirubin 0.3 0.3 - 1.2 mg/dL   GFR calc non Af Amer >60 >60 mL/min   GFR calc Af Amer >60 >60 mL/min    Comment: (NOTE) The eGFR has been calculated using the CKD EPI equation. This calculation has not been validated in all clinical situations. eGFR's persistently <60 mL/min signify possible Chronic Kidney Disease.    Anion gap 11 5 - 15  Ethanol (ETOH)     Status: None   Collection Time: 05/21/15  4:25 PM  Result Value Ref Range   Alcohol, Ethyl (B) <5 <5 mg/dL     Comment:        LOWEST DETECTABLE LIMIT FOR SERUM ALCOHOL IS 5 mg/dL FOR MEDICAL PURPOSES ONLY   Salicylate level     Status: None   Collection Time: 05/21/15  4:25 PM  Result Value Ref Range   Salicylate Lvl <1.3 2.8 - 30.0 mg/dL  Acetaminophen level     Status: Abnormal   Collection Time: 05/21/15  4:25 PM  Result Value Ref Range   Acetaminophen (Tylenol), Serum <10 (L) 10 - 30 ug/mL    Comment:        THERAPEUTIC CONCENTRATIONS VARY SIGNIFICANTLY. A RANGE OF 10-30 ug/mL MAY BE AN EFFECTIVE CONCENTRATION FOR MANY PATIENTS. HOWEVER, SOME ARE BEST TREATED AT CONCENTRATIONS OUTSIDE THIS RANGE. ACETAMINOPHEN CONCENTRATIONS >150 ug/mL AT 4 HOURS AFTER INGESTION AND >50 ug/mL AT 12 HOURS AFTER INGESTION  ARE OFTEN ASSOCIATED WITH TOXIC REACTIONS.   CBC     Status: None   Collection Time: 05/21/15  4:25 PM  Result Value Ref Range   WBC 8.3 4.0 - 10.5 K/uL   RBC 4.17 3.87 - 5.11 MIL/uL   Hemoglobin 13.4 12.0 - 15.0 g/dL   HCT 40.2 36.0 - 46.0 %   MCV 96.4 78.0 - 100.0 fL   MCH 32.1 26.0 - 34.0 pg   MCHC 33.3 30.0 - 36.0 g/dL   RDW 13.3 11.5 - 15.5 %   Platelets 249 150 - 400 K/uL  POC urine preg, ED (not at [facility])     Status: None   Collection Time: 05/21/15  4:51 PM  Result Value Ref Range   Preg Test, Ur NEGATIVE NEGATIVE    Comment:        THE SENSITIVITY OF THIS METHODOLOGY IS >24 mIU/mL   Urine rapid drug screen (hosp performed) (Not at [facility])     Status: Abnormal   Collection Time: 05/21/15  4:54 PM  Result Value Ref Range   Opiates NONE DETECTED NONE DETECTED   Cocaine NONE DETECTED NONE DETECTED   Benzodiazepines NONE DETECTED NONE DETECTED   Amphetamines POSITIVE (A) NONE DETECTED   Tetrahydrocannabinol POSITIVE (A) NONE DETECTED   Barbiturates NONE DETECTED NONE DETECTED    Comment:        DRUG SCREEN FOR MEDICAL PURPOSES ONLY.  IF CONFIRMATION IS NEEDED FOR ANY PURPOSE, NOTIFY LAB WITHIN 5 DAYS.        LOWEST DETECTABLE LIMITS FOR URINE DRUG  SCREEN Drug Class       Cutoff (ng/mL) Amphetamine      1000 Barbiturate      200 Benzodiazepine   740 Tricyclics       814 Opiates          300 Cocaine          300 THC              50   RPR     Status: None   Collection Time: 05/21/15  5:40 PM  Result Value Ref Range   RPR Ser Ql Non Reactive Non Reactive    Comment: (NOTE) Performed At: [facility] [address] [place], [place] [id number] Lindon Romp MD [id number]   HIV antibody     Status: None   Collection Time: 05/21/15  5:40 PM  Result Value Ref Range   HIV Screen 4th Generation wRfx Non Reactive Non Reactive    Comment: (NOTE) Performed At: [facility] [address] [place], [place] [id number] Lindon Romp MD [id number]   Wet prep, genital     Status: Abnormal   Collection Time: 05/21/15  5:44 PM  Result Value Ref Range   Yeast Wet Prep HPF POC NONE SEEN NONE SEEN   Trich, Wet Prep NONE SEEN NONE SEEN   Clue Cells Wet Prep HPF POC NONE SEEN NONE SEEN   WBC, Wet Prep HPF POC MODERATE (A) NONE SEEN    Metabolic Disorder Labs:  No results found for: HGBA1C, MPG No results found for: PROLACTIN Lab Results  Component Value Date   CHOL 186 11/21/2013   TRIG 141 11/21/2013   HDL 57 11/21/2013   LDLCALC 101* 11/21/2013    Current Medications: Current Facility-Administered Medications  Medication Dose Route Frequency Provider Last Rate Last Dose  . acetaminophen (TYLENOL) tablet 650 mg  650 mg Oral Q6H PRN Harriet Butte, NP      .  alum & mag hydroxide-simeth (MAALOX/MYLANTA) 200-200-20 MG/5ML suspension 30 mL  30 mL Oral Q4H PRN Harriet Butte, NP   30 mL at 05/22/15 1258  . amphetamine-dextroamphetamine (ADDERALL XR) 24 hr capsule 20 mg  20 mg Oral BH-q7a Harriet Butte, NP   20 mg at 05/22/15 0910  . escitalopram (LEXAPRO) tablet 10 mg  10 mg Oral Daily Harriet Butte, NP   10 mg at 05/22/15 0355  . famotidine (PEPCID) tablet 20 mg  20 mg Oral Daily Harriet Butte, NP    20 mg at 05/22/15 9741  . hydrOXYzine (ATARAX/VISTARIL) tablet 25 mg  25 mg Oral TID PRN Harriet Butte, NP      . magnesium hydroxide (MILK OF MAGNESIA) suspension 30 mL  30 mL Oral Daily PRN Harriet Butte, NP      . metroNIDAZOLE (FLAGYL) tablet 500 mg  500 mg Oral BID Harriet Butte, NP   500 mg at 05/22/15 0910  . Norgestimate-Ethinyl Estradiol Triphasic 0.18/0.215/0.25 MG-35 MCG tablet 1 tablet  1 tablet Oral Daily Harriet Butte, NP   1 tablet at 05/22/15 0911  . traZODone (DESYREL) tablet 50 mg  50 mg Oral QHS PRN Harriet Butte, NP       PTA Medications: Prescriptions prior to admission  Medication Sig Dispense Refill Last Dose  . amphetamine-dextroamphetamine (ADDERALL XR) 20 MG 24 hr capsule Take 1 capsule (20 mg total) by mouth every morning. 30 capsule 0 05/21/2015  . diphenhydramine-acetaminophen (TYLENOL PM) 25-500 MG TABS tablet Take 1 tablet by mouth at bedtime.   05/20/2015  . escitalopram (LEXAPRO) 10 MG tablet Take 1 tablet (10 mg total) by mouth daily. 30 tablet 5 05/21/2015  . famotidine (PEPCID) 20 MG tablet Take 20 mg by mouth daily.   Past Week at Unknown time  . ibuprofen (ADVIL,MOTRIN) 200 MG tablet Take 200 mg by mouth every 6 (six) hours as needed for mild pain or moderate pain.   05/21/2015  . MELATONIN PO Take 2 tablets by mouth at bedtime.   05/21/2015  . metroNIDAZOLE (FLAGYL) 500 MG tablet Take 1 tablet (500 mg total) by mouth 2 (two) times daily. One po bid x 7 days 14 tablet 0 05/21/2015 at 2130  . TRI-SPRINTEC 0.18/0.215/0.25 MG-35 MCG tablet TAKE ONE TABLET BY MOUTH ONCE DAILY 28 tablet 2 05/21/2015  . TRI-SPRINTEC 0.18/0.215/0.25 MG-35 MCG tablet TAKE ONE TABLET BY MOUTH ONCE DAILY 28 tablet 2 Not Taking at Unknown time    Musculoskeletal: Strength & Muscle Tone: within normal limits Gait & Station: normal Patient leans: N/A  Psychiatric Specialty Exam: Physical Exam  ROS  Blood pressure 113/68, pulse 76, temperature 98.4 F (36.9 C),  temperature source Oral, resp. rate 18, height _0  (1.651 m), weight 108.863 kg (240 lb), last menstrual period 04/22/2015.Body mass index is 39.94 kg/(m^2).  General Appearance: Fairly Groomed  Engineer, water::  Fair  Speech:  Clear and Coherent and Normal Rate  Volume:  Normal  Mood:  Depressed, Hopeless and Worthless  Affect:  Depressed and Flat  Thought Process:  Circumstantial, Coherent and Goal Directed  Orientation:  Full (Time, Place, and Person)  Thought Content:  WDL  Suicidal Thoughts:  No  Homicidal Thoughts:  No  Memory:  Immediate;   Good Recent;   Good Remote;   Good  Judgement:  Intact  Insight:  Fair  Psychomotor Activity:  Normal  Concentration:  Fair  Recall:  [place] of Knowledge:Good  Language: Good  Akathisia:  No  Handed:  Right  AIMS (if indicated):     Assets:  Communication Skills Desire for Improvement Financial Resources/Insurance Housing Intimacy Physical Health Social Support  ADL's:  Intact  Cognition: WNL  Sleep:  Number of Hours: 3.5     Treatment Plan Summary: Daily contact with patient to assess and evaluate symptoms and progress in treatment and Medication management 1 Admit for crisis management and stabilization.  2. Medication management to reduce symptoms to baseline and improved the patient's overall level of functioning. Closely monitor the side effects, efficacy and therapeutic response of medication. Will d/c Adderall since uds +for marijuana. Will d/c Lexapro, and start Prozac.  3. Treat health problem as indicated.  4. Developed treatment plan to decrease the risk of relapse upon discharge and to reduce the need for readmission.  5. Psychosocial education regarding relapse prevention in self-care.  6. Healthcare followup as needed for medical problems and called consults as indicated.  7. Increase collateral information.  8. Restart home medication where appropriate  9. Encouraged to participate and verbalize into group  milieu therapy.   Observation Level/Precautions:  15 minute checks  Laboratory:  labs obtained in ED have been reviewed.   Psychotherapy:  Individual and group therapy  Medications:See above    Consultations:  As needed  Discharge Concerns:  Safety  Estimated LOS: 3-5 days  Other:     I certify that inpatient services furnished can reasonably be expected to improve the patient's condition.   Nanci Pina FNP-BC 10/22/20161:55 PM   Patient seen face to face for psychiatric evaluation. Chart reviewed and finding discussed with Physician extender. Agreed with disposition and treatment plan.   Berniece Andreas, MD

## 2015-05-22 NOTE — Progress Notes (Signed)
D:Pt reports that her biggest stressors are living with her two brothers, father and two year old son in a two bedroom residence. She is going to school and taking care of her son. Pt says that she has became hopeless with increased depression. A:Offered support, encouragement and 15 minute checks. R:Safety maintained on the unit.

## 2015-05-22 NOTE — Progress Notes (Signed)
D: Pt denies SI/HI/AVH. Pt is pleasant and cooperative. Pt a little depressed ad sad on approach. Pt feels that her situation is hopeless. Pt did say she was feeling good today. Pt very emotional, with crying spells when talking about her situation.   A: Pt was offered support and encouragement. Pt was given scheduled medications. Pt was encourage to attend groups. Q 15 minute checks were done for safety.   R:Pt attends groups and interacts well with peers and staff. Pt is taking medication. Pt has no complaints.Pt receptive to treatment and safety maintained on unit.
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
## 2015-05-22 NOTE — BHH Group Notes (Signed)
B[facility] Group Notes:  (Clinical Social Work)  05/22/2015     1:15-2:15PM  Summary of Progress/Problems:  Motivational Interviewing and the whiteboard were utilized to help patients explore in depth the perceived benefits and costs of unhealthy coping techniques, as well as the  benefits and costs of replacing that with a healthy coping skills.   The patient expressed that their own unhealthy coping involves self-deprecating humor which she started at age 89yo to handle being overweight.  Type of Therapy:  Group Therapy - Process   Participation Level:  Active  Participation Quality:  Appropriate and Attentive  Affect:  Blunted  Cognitive:  Appropriate  Insight:  Engaged  Engagement in Therapy:  Engaged  Modes of Intervention:  Education, Motivational Interviewing  Ambrose MantleMareida Grossman-Orr, LCSW 05/22/2015, 4:25 PM

## 2015-05-22 NOTE — Tx Team (Addendum)
Initial Interdisciplinary Treatment Plan   PATIENT STRESSORS: Loss of mom School Financial   PATIENT STRENGTHS: Ability for insight Average or above average intelligence General fund of knowledge Physical Health Supportive family/friends   PROBLEM LIST: Problem List/Patient Goals Date to be addressed Date deferred Reason deferred Estimated date of resolution  Depression with S.I." Losing Thoughts of S.I."            Ineffective Coping      "Outpatient Therapy set up prior discharge"        Anxiety "medicat6ion was changed because of Anxiety"                               DISCHARGE CRITERIA:  Improved stabilization in mood, thinking, and/or behavior Motivation to continue treatment in a less acute level of care Reduction of life-threatening or endangering symptoms to within safe limits Verbal commitment to aftercare and medication compliance  PRELIMINARY DISCHARGE PLAN: Outpatient therapy Return to previous living arrangement Return to previous work or school arrangements  PATIENT/FAMIILY INVOLVEMENT: This treatment plan has been presented to and reviewed with the patient, Tara Randall, and/or family member,.  The patient and family have been given the opportunity to ask questions and make suggestions.  Lawrence SantiagoFleming, Samayah Novinger J 05/22/2015, 1:09 AM

## 2015-05-22 NOTE — Progress Notes (Signed)
[facility] Group Notes:  (Nursing/MHT/Case Management/Adjunct)  Date:  05/22/2015  Time:  0900 Type of Therapy:  Nurse Education  Participation Level:  Active  Participation Quality:  Appropriate and Supportive  Affect:  Depressed  Cognitive:  Alert and Oriented  Insight:  lacking  Engagement in Group:  Poor  Modes of Intervention:  Discussion, Socialization and Support    Summary of Progress/Problems:Group included discussing coping skills for anxiety. Pt reported that she did not have any coping skills for anxiety. She did listen during entire group.   Beatrix ShipperWright, Ciarra Braddy Martin 05/22/2015, 3:10 PM

## 2015-05-22 NOTE — Plan of Care (Signed)
Problem: Ineffective individual coping Goal: STG: Patient will remain free from self harm Outcome: Progressing Pt denies si      
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
## 2015-05-22 NOTE — Progress Notes (Addendum)
Admitted this 29 y/o female patient with a hx of depression and suicidal ideation. Patient reports 3 months ago her medication was changed  From Effexor to Lexapro.  due to anxiety. She reports her anxiety has decreased some but her depression is worse than ever with thoughts to obtain a gun and shoot herself in the face. Stressors are school,money  , and loss of mother who abandoned family. Patient reports she has a long hx of depression with multiple suicide attempts and one psychiatric in patient admission as a adolescent. She is not receiving any thearpy at present and request out patient therapy at discharge. She reports hx of right leg and left ankle pain from recent injury and back pain from exercise. The pain she reports is controlled with Ibuprofen 3x/ daily. Denies S.I. at present and contracts for safety.

## 2015-05-23 NOTE — Progress Notes (Addendum)
Pt stated she did not feel like eating breakfast secondary to feeling nauseated. Pt requested more nausea medicine and pt was given 4mg  of zofran. Pt appears very sleepy. She is dishelved in her appearance. Pt does contract for safety and denies Si and Hi. Pt has spent the majority of the day in her room. She did shower and came into the dayroom prior to going to supper with the other pts.

## 2015-05-23 NOTE — BHH Group Notes (Signed)
BHH Group Notes:  Healthy coping systems  Date:  05/23/2015  Time:  10:38 AM  Type of Therapy:  Nurse Education  Participation Level:  Did Not Attend  Participation Quality:  Inattentive  Affect:  Flat  Cognitive:  Lacking  Insight:  None  Engagement in Group:  Lacking  Modes of Intervention:  Discussion  Summary of Progress/Problems:Pt did not attend  Rodman KeyWebb, Rosalea Withrow [facility] 05/23/2015, 10:38 AM

## 2015-05-23 NOTE — BHH Group Notes (Signed)
BHH Group Notes:  (Clinical Social Work)  05/23/2015  1:15-2:15PM  Summary of Progress/Problems: The main focus of today's process group was to  1) discuss the importance of adding supports 2) define health supports versus unhealthy supports 3) List possible positive and healthy supports that can be added for various purposes 4) Identify what healthy supports patient wants to add to her life  An emphasis was placed on using counselor, doctor, therapy groups, 12-step groups, and problem-specific support groups to expand supports.   The patient expressed full comprehension of the concepts presented, and agreed that there is a need to add more supports. The patient stated nothing during group, put her head down on table and appeared to doze.  When asked about healthy supports she thinks she could add to improve her mental health, she initially said "I don't know, what do you want to know" then when other patients explained what we had been discussing, said "a good diet."  Type of Therapy:  Process Group with Motivational Interviewing  Participation Level:  Minimal  Participation Quality:  Drowsy and Inattentive  Affect:  Flat and Irritable  Cognitive:  n/a  Insight:  Limited  Engagement in Therapy:  Poor  Modes of Intervention:   Education, Support and Processing, Activity  Tara MantleMareida Grossman-Orr, LCSW 05/23/2015

## 2015-05-23 NOTE — Progress Notes (Signed)
Psychoeducational Group Note  Date:  05/23/2015 Time:  0012  Group Topic/Focus:  Wrap-Up Group:   The focus of this group is to help patients review their daily goal of treatment and discuss progress on daily workbooks.  Participation Level: Did Not Attend  Participation Quality:  Not Applicable  Affect:  Not Applicable  Cognitive:  Not Applicable  Insight:  Not Applicable  Engagement in Group: Not Applicable  Additional Comments:  The patient did not attend group last evening.   Suvan Stcyr S 05/23/2015, 12:12 AM

## 2015-05-23 NOTE — Progress Notes (Signed)
[facility] MD Progress Note  05/23/2015 9:49 AM Tara Randall  MRN:  [id number] Subjective:  Patient seen and chart reviewed. Tara Randall is an 29 y.o. female that was referred to [facility] by her physician at [place] for SI. Pt reports she has SI with a plan to "blow my face off with a gun." Pt has a 72 month old son and her father helps her care for him. Pt reports multiple stressors like finances, having a child with no support from father's child, being in college and failing the one class she is in, and dealing with her mother leaving her entire family 3 years ago.  Upon assessment patient is doing well. States she slept very well "too good I got two sleeping pills last night and I think it was too much. I slept from 10pm to 11am. My family came to visit but the rules were wrong and I only got to see them for five minutes. My son is the reason why I came here, so that I can get better. I have to see him. Other than that I am feeling ok. Currently rates depression 2/10, anxiety 2/10, and hopelessness 0/10. She has been active on the unit and mileu, she also seen in groups participating. But states that hearing others peoples problems can be overwhelming at time. Denies any side effects of this medications. Denies SI/HI/AVH.   Principal Problem: Major depressive disorder, recurrent episode, severe, with psychotic behavior ([place]) Diagnosis:   Patient Active Problem List   Diagnosis Date Noted  . Major depressive disorder, recurrent episode, severe, with psychotic behavior ([place]) [F33.3] 05/22/2015  . Cannabis abuse [F12.10]   . Depression [F32.9] 01/26/2014  . GAD (generalized anxiety disorder) [F41.1] 01/26/2014  . ADD (attention deficit disorder) [F90.9] 01/26/2014   Total Time spent with patient: 30 minutes  Past Psychiatric History: MDD, cannabis abuse, GAD, ADD  Past Medical History: History reviewed. No pertinent past medical history.  Past Surgical History  Procedure  Laterality Date  . Appendectomy     Family History:  Family History  Problem Relation Age of Onset  . Depression Mother     Bi polar,manic  . Heart disease Father   . Depression Father   . ADD / ADHD Brother    Family Psychiatric  History: See HPI Social History:  History  Alcohol Use No     History  Drug Use No    Social History   Social History  . Marital Status: Single    Spouse Name: N/A  . Number of Children: N/A  . Years of Education: N/A   Social History Main Topics  . Smoking status: Former Research scientist (life sciences)  . Smokeless tobacco: None  . Alcohol Use: No  . Drug Use: No  . Sexual Activity: Yes    Birth Control/ Protection: Pill   Other Topics Concern  . None   Social History Narrative   Additional Social History:   Sleep: Good  Appetite:  Good  Current Medications: Current Facility-Administered Medications  Medication Dose Route Frequency Provider Last Rate Last Dose  . acetaminophen (TYLENOL) tablet 650 mg  650 mg Oral Q6H PRN Harriet Butte, NP   650 mg at 05/22/15 2024  . alum & mag hydroxide-simeth (MAALOX/MYLANTA) 200-200-20 MG/5ML suspension 30 mL  30 mL Oral Q4H PRN Harriet Butte, NP   30 mL at 05/22/15 1812  . famotidine (PEPCID) tablet 20 mg  20 mg Oral Daily Harriet Butte, NP   20 mg  at 05/22/15 0911  . FLUoxetine (PROZAC) capsule 10 mg  10 mg Oral Daily Nanci Pina, FNP   10 mg at 05/22/15 1535  . hydrOXYzine (ATARAX/VISTARIL) tablet 25 mg  25 mg Oral TID PRN Harriet Butte, NP   25 mg at 05/22/15 2223  . lamoTRIgine (LAMICTAL) tablet 25 mg  25 mg Oral Daily Kathlee Nations, MD   25 mg at 05/22/15 1532  . magnesium hydroxide (MILK OF MAGNESIA) suspension 30 mL  30 mL Oral Daily PRN Harriet Butte, NP      . metroNIDAZOLE (FLAGYL) tablet 500 mg  500 mg Oral BID Harriet Butte, NP   500 mg at 05/22/15 1703  . Norgestimate-Ethinyl Estradiol Triphasic 0.18/0.215/0.25 MG-35 MCG tablet 1 tablet  1 tablet Oral Daily Harriet Butte, NP   1 tablet at  05/22/15 2030  . traZODone (DESYREL) tablet 50 mg  50 mg Oral QHS PRN Harriet Butte, NP   50 mg at 05/22/15 2223    Lab Results:  Results for orders placed or performed during the hospital encounter of 05/21/15 (from the past 48 hour(s))  Comprehensive metabolic panel     Status: None   Collection Time: 05/21/15  4:25 PM  Result Value Ref Range   Sodium 138 135 - 145 mmol/L   Potassium 4.1 3.5 - 5.1 mmol/L   Chloride 103 101 - 111 mmol/L   CO2 24 22 - 32 mmol/L   Glucose, Bld 85 65 - 99 mg/dL   BUN 10 6 - 20 mg/dL   Creatinine, Ser 0.95 0.44 - 1.00 mg/dL   Calcium 9.8 8.9 - 10.3 mg/dL   Total Protein 7.0 6.5 - 8.1 g/dL   Albumin 3.6 3.5 - 5.0 g/dL   AST 30 15 - 41 U/L   ALT 41 14 - 54 U/L   Alkaline Phosphatase 51 38 - 126 U/L   Total Bilirubin 0.3 0.3 - 1.2 mg/dL   GFR calc non Af Amer >60 >60 mL/min   GFR calc Af Amer >60 >60 mL/min    Comment: (NOTE) The eGFR has been calculated using the CKD EPI equation. This calculation has not been validated in all clinical situations. eGFR's persistently <60 mL/min signify possible Chronic Kidney Disease.    Anion gap 11 5 - 15  Ethanol (ETOH)     Status: None   Collection Time: 05/21/15  4:25 PM  Result Value Ref Range   Alcohol, Ethyl (B) <5 <5 mg/dL    Comment:        LOWEST DETECTABLE LIMIT FOR SERUM ALCOHOL IS 5 mg/dL FOR MEDICAL PURPOSES ONLY   Salicylate level     Status: None   Collection Time: 05/21/15  4:25 PM  Result Value Ref Range   Salicylate Lvl <2.1 2.8 - 30.0 mg/dL  Acetaminophen level     Status: Abnormal   Collection Time: 05/21/15  4:25 PM  Result Value Ref Range   Acetaminophen (Tylenol), Serum <10 (L) 10 - 30 ug/mL    Comment:        THERAPEUTIC CONCENTRATIONS VARY SIGNIFICANTLY. A RANGE OF 10-30 ug/mL MAY BE AN EFFECTIVE CONCENTRATION FOR MANY PATIENTS. HOWEVER, SOME ARE BEST TREATED AT CONCENTRATIONS OUTSIDE THIS RANGE. ACETAMINOPHEN CONCENTRATIONS >150 ug/mL AT 4 HOURS AFTER INGESTION AND  >50 ug/mL AT 12 HOURS AFTER INGESTION ARE OFTEN ASSOCIATED WITH TOXIC REACTIONS.   CBC     Status: None   Collection Time: 05/21/15  4:25 PM  Result Value Ref Range  WBC 8.3 4.0 - 10.5 K/uL   RBC 4.17 3.87 - 5.11 MIL/uL   Hemoglobin 13.4 12.0 - 15.0 g/dL   HCT 40.2 36.0 - 46.0 %   MCV 96.4 78.0 - 100.0 fL   MCH 32.1 26.0 - 34.0 pg   MCHC 33.3 30.0 - 36.0 g/dL   RDW 13.3 11.5 - 15.5 %   Platelets 249 150 - 400 K/uL  POC urine preg, ED (not at [facility])     Status: None   Collection Time: 05/21/15  4:51 PM  Result Value Ref Range   Preg Test, Ur NEGATIVE NEGATIVE    Comment:        THE SENSITIVITY OF THIS METHODOLOGY IS >24 mIU/mL   Urine rapid drug screen (hosp performed) (Not at [facility])     Status: Abnormal   Collection Time: 05/21/15  4:54 PM  Result Value Ref Range   Opiates NONE DETECTED NONE DETECTED   Cocaine NONE DETECTED NONE DETECTED   Benzodiazepines NONE DETECTED NONE DETECTED   Amphetamines POSITIVE (A) NONE DETECTED   Tetrahydrocannabinol POSITIVE (A) NONE DETECTED   Barbiturates NONE DETECTED NONE DETECTED    Comment:        DRUG SCREEN FOR MEDICAL PURPOSES ONLY.  IF CONFIRMATION IS NEEDED FOR ANY PURPOSE, NOTIFY LAB WITHIN 5 DAYS.        LOWEST DETECTABLE LIMITS FOR URINE DRUG SCREEN Drug Class       Cutoff (ng/mL) Amphetamine      1000 Barbiturate      200 Benzodiazepine   027 Tricyclics       741 Opiates          300 Cocaine          300 THC              50   RPR     Status: None   Collection Time: 05/21/15  5:40 PM  Result Value Ref Range   RPR Ser Ql Non Reactive Non Reactive    Comment: (NOTE) Performed At: [facility] [address] [place], [place] [id number] Lindon Romp MD [id number]   HIV antibody     Status: None   Collection Time: 05/21/15  5:40 PM  Result Value Ref Range   HIV Screen 4th Generation wRfx Non Reactive Non Reactive    Comment: (NOTE) Performed At: [facility] [place], [place]  [id number] Lindon Romp MD [id number]   Wet prep, genital     Status: Abnormal   Collection Time: 05/21/15  5:44 PM  Result Value Ref Range   Yeast Wet Prep HPF POC NONE SEEN NONE SEEN   Trich, Wet Prep NONE SEEN NONE SEEN   Clue Cells Wet Prep HPF POC NONE SEEN NONE SEEN   WBC, Wet Prep HPF POC MODERATE (A) NONE SEEN    Physical Findings: AIMS: Facial and Oral Movements Muscles of Facial Expression: None, normal Lips and Perioral Area: None, normal Jaw: None, normal Tongue: None, normal,Extremity Movements Upper (arms, wrists, hands, fingers): None, normal Lower (legs, knees, ankles, toes): None, normal, Trunk Movements Neck, shoulders, hips: None, normal, Overall Severity Severity of abnormal movements (highest score from questions above): None, normal Incapacitation due to abnormal movements: None, normal Patient's awareness of abnormal movements (rate only patient's report): No Awareness, Dental Status Current problems with teeth and/or dentures?: No Does patient usually wear dentures?: No  CIWA:    COWS:     Musculoskeletal: Strength & Muscle Tone: within normal  limits Gait & Station: normal Patient leans: N/A  Psychiatric Specialty Exam: Review of Systems  All other systems reviewed and are negative.   Blood pressure 107/59, pulse 77, temperature 98.5 F (36.9 C), temperature source Oral, resp. rate 18, height _0  (1.651 m), weight 108.863 kg (240 lb), last menstrual period 04/22/2015.Body mass index is 39.94 kg/(m^2).  General Appearance: Casual and Fairly Groomed  Engineer, water::  Fair  Speech:  Clear and Coherent and Normal Rate  Volume:  Normal  Mood:  Depressed  Affect:  Depressed and Flat  Thought Process:  Circumstantial, Coherent and Intact  Orientation:  Full (Time, Place, and Person)  Thought Content:  WDL  Suicidal Thoughts:  No  Homicidal Thoughts:  No  Memory:  Immediate;   Good Recent;   Good Remote;   Good  Judgement:  Intact   Insight:  Fair  Psychomotor Activity:  Normal  Concentration:  Fair  Recall:  [employer] of Knowledge:Fair  Language: Good  Akathisia:  No  Handed:  Right  AIMS (if indicated):     Assets:  Communication Skills Desire for Improvement Financial Resources/Insurance Housing Intimacy Leisure Time Physical Health Social Support  ADL's:  Intact  Cognition: WNL  Sleep:  Number of Hours: 6   Treatment Plan Summary: Daily contact with patient to assess and evaluate symptoms and progress in treatment and Medication management 1 Admit for crisis management and stabilization.  2. Medication management to reduce symptoms to baseline and improved the patient's overall level of functioning. Closely monitor the side effects, efficacy and therapeutic response of medication. Will d/c Adderall since uds +for marijuana. Will d/c Lexapro, and start Prozac. Reviewed prn medications with patient, she has agreed to take Hydroxyzine only versus Trazodone.  3. Treat health problem as indicated.  4. Developed treatment plan to decrease the risk of relapse upon discharge and to reduce the need for readmission.  5. Psychosocial education regarding relapse prevention in self-care.  6. Healthcare followup as needed for medical problems and called consults as indicated.  7. Increase collateral information.  8. Restart home medication where appropriate  9. Encouraged to participate and verbalize into group milieu therapy.    Priscille Loveless S FNP-BC 05/23/2015, 9:49 AM  I reviewed chart and agreed with the findings and treatment Plan.  Berniece Andreas, MD

## 2015-05-23 NOTE — Plan of Care (Signed)
Problem: Ineffective individual coping Goal: LTG: Patient will report a decrease in negative feelings Outcome: Progressing Pt stated she felt good today Goal: STG: Patient will remain free from self harm Outcome: Progressing Pt safe on the unit

## 2015-05-24 LAB — GC/CHLAMYDIA PROBE AMP (~~LOC~~) NOT AT ARMC
CHLAMYDIA, DNA PROBE: NEGATIVE
NEISSERIA GONORRHEA: NEGATIVE

## 2015-05-24 MED ORDER — AMPHETAMINE-DEXTROAMPHET ER 10 MG PO CP24
10.0000 mg | ORAL_CAPSULE | Freq: Every day | ORAL | Status: DC
  Administered 2015-05-25 – 2015-05-27 (×3): 10 mg via ORAL
  Filled 2015-05-24 (×3): qty 1

## 2015-05-24 MED ORDER — FLUOXETINE HCL 20 MG PO CAPS
20.0000 mg | ORAL_CAPSULE | Freq: Every day | ORAL | Status: DC
  Administered 2015-05-25 – 2015-05-27 (×3): 20 mg via ORAL
  Filled 2015-05-24 (×6): qty 1

## 2015-05-24 NOTE — Plan of Care (Signed)
Problem: Alteration in mood Goal: LTG-Patient reports reduction in suicidal thoughts (Patient reports reduction in suicidal thoughts and is able to verbalize a safety plan for whenever patient is feeling suicidal)  Outcome: Progressing Pt denies SI at this time     

## 2015-05-24 NOTE — Progress Notes (Signed)
D:  Patient's self inventory sheet, patient has good sleep, sleep medication is helpful.  Good appetite, normal energy level, good concentration.  Denied depression, hopeless and anxiety.  Denied withdrawals.  Denied SI.  Denied withdrawals.  Denied physical problems. Goal is to participate in groups.  Plans to attend all group.  "A good way to have a meaningful conversation is to bring up my boy.  Small talk does not help as much as true conversation."  Does not have discharge plans. A:  Medications administered per MD orders.  Emotional support and encouragement given patient. R:  Denied SI and HI, contracts for safety.  Denied A/V hallucinations.  Safety maintained with 15 minute checks.

## 2015-05-24 NOTE — [facility]HH Group Notes (Signed)
[facility]  05/24/2015 8:45 AM  Participation Quality: Alert, Appropriate and Oriented  Mood/Affect: Irritable  Depression Rating: 0  Anxiety Rating: 0  Thoughts of Suicide: Pt denies SI/HI  Will you contract for safety? Yes  Current AVH: Pt denies  Plan for Discharge/Comments: Pt attended discharge planning group and actively participated in group. CSW discussed suicide prevention education with the group and encouraged them to discuss discharge planning and any relevant barriers. Pt focused on her dissatisfaction with treatment on the unit. Even when validated and redirected, she returned to the subject and brought in other members to the discussion as well.  Transportation Means: Pt reports access to transportation  Supports: No supports mentioned at this time  Chad CordialLauren Carter, LCSWA 05/24/2015 10:21 AM

## 2015-05-24 NOTE — Progress Notes (Signed)
Recreation Therapy Notes  Date: 10.24.2016 Time: 9:30am Location: [address] Group Room   Group Topic: Stress Management  Goal Area(s) Addresses:  Patient will actively participate in stress management techniques presented during session.   Behavioral Response: Appropriate   Intervention: Stress management techniques  Activity :  Deep Breathing and Progressive Muscle Relaxation. LRT provided instruction and demonstration on practice of Progressive Muscle Relaxation. Technique was coupled with deep breathing.   Education:  Stress Management, Discharge Planning.   Education Outcome: Acknowledges education  Clinical Observations/Feedback: Patient actively engaged in technique introduced, expressed no concerns and demonstrated ability to practice independently post d/c.   Kennetha Pearman L Emya Picado, LRT/CTRS        Brad Mcgaughy L 05/24/2015 10:09 AM 

## 2015-05-24 NOTE — [facility] Group Notes (Signed)
[facility] LCSW Group Therapy  05/24/2015 1:15pm  Type of Therapy:  Group Therapy vercoming Obstacles  Participation Level:  Active  Participation Quality:  Appropriate   Affect:  Appropriate  Cognitive:  Appropriate and Oriented  Insight:  Developing/Improving and Improving  Engagement in Therapy:  Improving  Modes of Intervention:  Discussion, Exploration, Problem-solving and Support  Description of Group:   In this group patients will be encouraged to explore what they see as obstacles to their own wellness and recovery. They will be guided to discuss their thoughts, feelings, and behaviors related to these obstacles. The group will process together ways to cope with barriers, with attention given to specific choices patients can make. Each patient will be challenged to identify changes they are motivated to make in order to overcome their obstacles. This group will be process-oriented, with patients participating in exploration of their own experiences as well as giving and receiving support and challenge from other group members.  Summary of Patient Progress: Pt identifies her financial stress due to school as a major obstacle in her life. Pt describes feeling anxious and frustrated. Pt can be inappropriate at times, asking another peer, "Have you been diagnosed with ADHD, because you seem like you're ADHD." Pt also continued to try to discuss medications with other peers as well. Pt less preoccupied in group with her dissatisfaction with treatment at hospital.   Therapeutic Modalities:   Cognitive Behavioral Therapy Solution Focused Therapy Motivational Interviewing Relapse Prevention Therapy   Chad CordialLauren Carter, LCSWA 05/24/2015 4:11 PM

## 2015-05-24 NOTE — [facility] Group Notes (Signed)
Adult Psychoeducational Group Note  Date:  05/24/2015 Time:  9:01 PM  Group Topic/Focus:  Wrap-Up Group:   The focus of this group is to help patients review their daily goal of treatment and discuss progress on daily workbooks.  Participation Level:  Active  Participation Quality:  Appropriate and Attentive  Affect:  Appropriate  Cognitive:  Alert and Appropriate  Insight: Appropriate  Engagement in Group:  Engaged  Modes of Intervention:  Discussion and Education  Additional Comments:  Pt stated she had a good day fun times with peers outside and the food was exceptional today.  [name], Tara Randall 05/24/2015, 9:01 PM

## 2015-05-24 NOTE — Progress Notes (Signed)
[facility] Group Notes:  (Nursing/MHT/Case Management/Adjunct)  Date:  05/24/2015  Time:  12:27 AM  Type of Therapy:  Psychoeducational Skills  Participation Level:  Active  Participation Quality:  Appropriate  Affect:  Appropriate  Cognitive:  Appropriate  Insight:  Appropriate  Engagement in Group:  Developing/Improving  Modes of Intervention:  Education  Summary of Progress/Problems: Patient states that she had a better day today and that she is learning to adjust to her new medication. In addition, she mentioned that she felt drowsy earlier in the day, but that the condition improved later in the day. In terms of the theme of the day, her support system will consist of her friend.   Brittini Brubeck S 05/24/2015, 12:27 AM

## 2015-05-24 NOTE — Progress Notes (Signed)
D: Pt denies SI/HI/AVH. Pt is pleasant and cooperative. Pt stated she felt a lot better, she is starting to adjust to her medications.   A: Pt was offered support and encouragement. Pt was given scheduled medications. Pt was encourage to attend groups. Q 15 minute checks were done for safety.   R:Pt attends groups and interacts well with peers and staff. Pt is taking medication. Pt has no complaints.Pt receptive to treatment and safety maintained on unit.

## 2015-05-24 NOTE — Plan of Care (Signed)
Problem: Consults Goal: Depression Patient Education See Patient Education Module for education specifics.  Outcome: Progressing Nurse discussed depression/coping skills with patient.        

## 2015-05-24 NOTE — Progress Notes (Signed)
Patient ID: Tara Randall, female   DOB: 1985/10/16, 29 y.o.   MRN: [id number] [facility] MD Progress Note  05/24/2015 4:51 PM Tara Randall  MRN:  [id number] Subjective:   Patient reports she is feeling better compared to admission. She reports improving mood, and at this time denies any suicidal ideations. She states recent medication changes were poorly tolerated and states  " I have not had a good medication " trial . She does state Prozac was an effective and well tolerated medication for her in the past, and does not remember having had any side effects from it . Objective: I have discussed case with treatment team and have met with patient. As discussed with staff, patient reports improved mood, affect, and has denied any ongoing suicidal ideations Patient is a 29 year old female, who lives with her father, and with her child, stressors include not having child's father 's support in caring for child, having a  Limited support network and being currently unemployed. She presented to hospital due to worsening depression and suicidal ideations, with a specific thought of SI by shooting self . Of note, patient reports long history of ADD, and states Adderall helped significantly. Denies any abuse of Adderall, denies side effects. She states she was taking Adderall XR 20 mgrs QDAY up to admission.  As noted, at this time reports partial improvement  Of mood and at this time denies any SI. She is tolerating medications well at present , denies side effects. Patient presents cooperative, calm, minimizes current neuro-vegetative symptoms, does appear mildly constricted and vaguely irritable in affect .   Principal Problem: Major depressive disorder, recurrent episode, severe, with psychotic behavior ([place]) Diagnosis:   Patient Active Problem List   Diagnosis Date Noted  . Major depressive disorder, recurrent episode, severe, with psychotic behavior ([place]) [F33.3] 05/22/2015  . Cannabis abuse [F12.10]   .  Depression [F32.9] 01/26/2014  . GAD (generalized anxiety disorder) [F41.1] 01/26/2014  . ADD (attention deficit disorder) [F90.9] 01/26/2014   Total Time spent with patient: 25 minutes   Past Psychiatric History: MDD, cannabis abuse, GAD, ADD  Past Medical History: History reviewed. No pertinent past medical history.  Past Surgical History  Procedure Laterality Date  . Appendectomy     Family History:  Family History  Problem Relation Age of Onset  . Depression Mother     Bi polar,manic  . Heart disease Father   . Depression Father   . ADD / ADHD Brother    Family Psychiatric  History: See HPI Social History:  History  Alcohol Use No     History  Drug Use No    Social History   Social History  . Marital Status: Single    Spouse Name: N/A  . Number of Children: N/A  . Years of Education: N/A   Social History Main Topics  . Smoking status: Former Research scientist (life sciences)  . Smokeless tobacco: None  . Alcohol Use: No  . Drug Use: No  . Sexual Activity: Yes    Birth Control/ Protection: Pill   Other Topics Concern  . None   Social History Narrative   Additional Social History:   Sleep: Good- improved  Appetite:  Good  Current Medications: Current Facility-Administered Medications  Medication Dose Route Frequency Provider Last Rate Last Dose  . acetaminophen (TYLENOL) tablet 650 mg  650 mg Oral Q6H PRN Harriet Butte, NP   650 mg at 05/24/15 0804  . alum & mag hydroxide-simeth (MAALOX/MYLANTA) 200-200-20 MG/5ML suspension 30 mL  30 mL Oral Q4H PRN Harriet Butte, NP   30 mL at 05/24/15 0803  . famotidine (PEPCID) tablet 20 mg  20 mg Oral Daily Harriet Butte, NP   20 mg at 05/24/15 0806  . [START ON 05/25/2015] FLUoxetine (PROZAC) capsule 20 mg  20 mg Oral Daily Myer Peer Kariem Wolfson, MD      . hydrOXYzine (ATARAX/VISTARIL) tablet 25 mg  25 mg Oral TID PRN Harriet Butte, NP   25 mg at 05/23/15 2214  . lamoTRIgine (LAMICTAL) tablet 25 mg  25 mg Oral Daily Kathlee Nations, MD    25 mg at 05/24/15 0807  . magnesium hydroxide (MILK OF MAGNESIA) suspension 30 mL  30 mL Oral Daily PRN Harriet Butte, NP      . metroNIDAZOLE (FLAGYL) tablet 500 mg  500 mg Oral BID Harriet Butte, NP   500 mg at 05/24/15 1637  . Norgestimate-Ethinyl Estradiol Triphasic 0.18/0.215/0.25 MG-35 MCG tablet 1 tablet  1 tablet Oral Daily Harriet Butte, NP   1 tablet at 05/24/15 0804  . traZODone (DESYREL) tablet 50 mg  50 mg Oral QHS PRN Harriet Butte, NP   50 mg at 05/22/15 2223    Lab Results:  No results found for this or any previous visit (from the past 48 hour(s)).  Physical Findings: AIMS: Facial and Oral Movements Muscles of Facial Expression: None, normal Lips and Perioral Area: None, normal Jaw: None, normal Tongue: None, normal,Extremity Movements Upper (arms, wrists, hands, fingers): None, normal Lower (legs, knees, ankles, toes): None, normal, Trunk Movements Neck, shoulders, hips: None, normal, Overall Severity Severity of abnormal movements (highest score from questions above): None, normal Incapacitation due to abnormal movements: None, normal Patient's awareness of abnormal movements (rate only patient's report): No Awareness, Dental Status Current problems with teeth and/or dentures?: No Does patient usually wear dentures?: No  CIWA:  CIWA-Ar Total: 1 COWS:  COWS Total Score: 2  Musculoskeletal: Strength & Muscle Tone: within normal limits Gait & Station: normal Patient leans: N/A  Psychiatric Specialty Exam: Review of Systems  All other systems reviewed and are negative.   Blood pressure 125/77, pulse 107, temperature 98.5 F (36.9 C), temperature source Oral, resp. rate 18, height _0  (1.651 m), weight 240 lb (108.863 kg), last menstrual period 04/22/2015.Body mass index is 39.94 kg/(m^2).  General Appearance: Casual  Eye Contact::  Good  Speech:  Clear and Coherent and Normal Rate  Volume:  Normal  Mood:  Reports improved mood   Affect:  Reactive,  slightly irritable   Thought Process:  Intact and Linear  Orientation:  Full (Time, Place, and Person)  Thought Content:  Denies hallucinations, no delusions, not internally preoccupied   Suicidal Thoughts:  No- denies any current suicidal ideations, denies any self injurious ideations, contracts for safety   Homicidal Thoughts:  No  Memory:  Immediate;   Good Recent;   Good Remote;   Good  Judgement:  Present   Insight:  Fair  Psychomotor Activity:  Normal  Concentration:  Good  Recall:  Good  Fund of Knowledge:Good  Language: Good  Akathisia:  No  Handed:  Right  AIMS (if indicated):     Assets:  Communication Skills Desire for Improvement Financial Resources/Insurance Housing Intimacy Leisure Time Physical Health Social Support  ADL's:  Intact  Cognition: WNL  Sleep:  Number of Hours: 6.5  Assessment - at this time  Patient is improved compared to admission- reports improved mood, denies any ongoing  suicidal ideations, does not endorse current significant neuro-vegetative symptoms of depression. She states Prozac has been helpful for her in the past , and did not have side effects. Patient states she has a long history of ADHD for which she has been on Wellbutrin ( did not work) and on Adderall in the past. Denies any abuse or misuse of Adderall, states she functions better when she is on it .  Treatment Plan Summary: Daily contact with patient to assess and evaluate symptoms and progress in treatment and Medication management Continue to encourage group, milieu participation to work on coping skills and symptom reduction Continue Prozac at 20 mgrs QDAY for depression , anxiety Continue Lamictal 25 mgrs QDAY for depression augmentation, mood disorder Continue Trazodone for insomnia as needed  Restart Adderall XR 10 mgrs QAM for management of ADHD .  Treatment team working on discharge/ disposition options     Neita Garnet  MD  05/24/2015, 4:51 PM

## 2015-05-25 LAB — PREGNANCY, URINE: PREG TEST UR: NEGATIVE

## 2015-05-25 MED ORDER — LORATADINE 10 MG PO TABS
10.0000 mg | ORAL_TABLET | Freq: Every day | ORAL | Status: DC
  Administered 2015-05-25 – 2015-05-26 (×2): 10 mg via ORAL
  Filled 2015-05-25 (×4): qty 1

## 2015-05-25 NOTE — Progress Notes (Signed)
Patient upset because another patient has been monopolizing the groups, talks loudly.  Patient stated she has been stressed over this patient.  [facility] MD, charge nurse[facility]  Patient moved to 302-2.  Patient very appreciative that the staff is concerned and has moved her room to another hall.  Safety maintained with 15 minute checks.

## 2015-05-25 NOTE — Plan of Care (Signed)
Problem: Diagnosis: Increased Risk For Suicide Attempt Goal: STG-Patient Will Attend All Groups On The Unit Outcome: Progressing Pt attended and participated in evening wrap up group.     

## 2015-05-25 NOTE — Progress Notes (Signed)
D: Patient alert and oriented x 4. Patient denies pain SI/HI/AVH.  A: Staff to monitor Q 15 mins for safety. Encouragement and support offered. Scheduled medications administered per orders. R: Patient remains safe on the unit. Patient attended group tonight. Patient visible on hte unit and interacting with peers. Patient taking administered medications.  

## 2015-05-25 NOTE — Progress Notes (Signed)
Patient ID: Tara Randall, female   DOB: 1986-04-17, 29 y.o.   MRN: [id number] [facility] MD Progress Note  05/25/2015 2:44 PM Tara Randall  MRN:  [id number] Subjective:   Patient states " I feel OK today.'  Objective: I have discussed case with treatment team and have met with patient. As discussed with staff, patient today reports that her mood has  improved mood. Pt continues to tolerate her medications well. Pt denies any side effects. But she is concerned about having a runny nose and wants to know if any of her medications could cause that. She however does say that she did have a cold prior to admission . Pt is willing to give claritine a trial and see if that will help. She otherwise denies any other concerns. Per nursing - pt has been having problems with her room mate , seen as being irritable and loud at each other , required redirection. Discussed possibly moving them apart to different rooms , monitor on the unit.  Principal Problem: Major depressive disorder, recurrent episode, severe, with psychotic behavior ([place]) Diagnosis:   Patient Active Problem List   Diagnosis Date Noted  . Major depressive disorder, recurrent episode, severe, with psychotic behavior ([place]) [F33.3] 05/22/2015  . Cannabis abuse [F12.10]   . Depression [F32.9] 01/26/2014  . GAD (generalized anxiety disorder) [F41.1] 01/26/2014  . ADD (attention deficit disorder) [F90.9] 01/26/2014   Total Time spent with patient: 25 minutes   Past Psychiatric History: MDD, cannabis abuse, GAD, ADD  Past Medical History: History reviewed. No pertinent past medical history.  Past Surgical History  Procedure Laterality Date  . Appendectomy     Family History:  Family History  Problem Relation Age of Onset  . Depression Mother     Bi polar,manic  . Heart disease Father   . Depression Father   . ADD / ADHD Brother    Family Psychiatric  History: See HPI Social History:  History  Alcohol Use No     History  Drug Use  No    Social History   Social History  . Marital Status: Single    Spouse Name: N/A  . Number of Children: N/A  . Years of Education: N/A   Social History Main Topics  . Smoking status: Former Research scientist (life sciences)  . Smokeless tobacco: None  . Alcohol Use: No  . Drug Use: No  . Sexual Activity: Yes    Birth Control/ Protection: Pill   Other Topics Concern  . None   Social History Narrative   Additional Social History:   Sleep: Good- improved  Appetite:  Good  Current Medications: Current Facility-Administered Medications  Medication Dose Route Frequency Provider Last Rate Last Dose  . acetaminophen (TYLENOL) tablet 650 mg  650 mg Oral Q6H PRN Harriet Butte, NP   650 mg at 05/24/15 0804  . alum & mag hydroxide-simeth (MAALOX/MYLANTA) 200-200-20 MG/5ML suspension 30 mL  30 mL Oral Q4H PRN Harriet Butte, NP   30 mL at 05/25/15 0858  . amphetamine-dextroamphetamine (ADDERALL XR) 24 hr capsule 10 mg  10 mg Oral Daily Jenne Campus, MD   10 mg at 05/25/15 0855  . famotidine (PEPCID) tablet 20 mg  20 mg Oral Daily Harriet Butte, NP   20 mg at 05/25/15 0855  . FLUoxetine (PROZAC) capsule 20 mg  20 mg Oral Daily Jenne Campus, MD   20 mg at 05/25/15 0855  . hydrOXYzine (ATARAX/VISTARIL) tablet 25 mg  25 mg Oral TID PRN  Harriet Butte, NP   25 mg at 05/23/15 2214  . lamoTRIgine (LAMICTAL) tablet 25 mg  25 mg Oral Daily Kathlee Nations, MD   25 mg at 05/25/15 0855  . loratadine (CLARITIN) tablet 10 mg  10 mg Oral QHS Tennille Montelongo, MD      . magnesium hydroxide (MILK OF MAGNESIA) suspension 30 mL  30 mL Oral Daily PRN Harriet Butte, NP      . metroNIDAZOLE (FLAGYL) tablet 500 mg  500 mg Oral BID Harriet Butte, NP   500 mg at 05/25/15 0855  . Norgestimate-Ethinyl Estradiol Triphasic 0.18/0.215/0.25 MG-35 MCG tablet 1 tablet  1 tablet Oral Daily Harriet Butte, NP   1 tablet at 05/25/15 0856  . traZODone (DESYREL) tablet 50 mg  50 mg Oral QHS PRN Harriet Butte, NP   50 mg at  05/24/15 2307    Lab Results:  No results found for this or any previous visit (from the past 48 hour(s)).  Physical Findings: AIMS: Facial and Oral Movements Muscles of Facial Expression: None, normal Lips and Perioral Area: None, normal Jaw: None, normal Tongue: None, normal,Extremity Movements Upper (arms, wrists, hands, fingers): None, normal Lower (legs, knees, ankles, toes): None, normal, Trunk Movements Neck, shoulders, hips: None, normal, Overall Severity Severity of abnormal movements (highest score from questions above): None, normal Incapacitation due to abnormal movements: None, normal Patient's awareness of abnormal movements (rate only patient's report): No Awareness, Dental Status Current problems with teeth and/or dentures?: No Does patient usually wear dentures?: No  CIWA:  CIWA-Ar Total: 1 COWS:  COWS Total Score: 2  Musculoskeletal: Strength & Muscle Tone: within normal limits Gait & Station: normal Patient leans: N/A  Psychiatric Specialty Exam: Review of Systems  Psychiatric/Behavioral: The patient is nervous/anxious.   All other systems reviewed and are negative.   Blood pressure 113/99, pulse 92, temperature 98.8 F (37.1 C), temperature source Oral, resp. rate 16, height 5' 5"  (1.651 m), weight 108.863 kg (240 lb), last menstrual period 04/22/2015.Body mass index is 39.94 kg/(m^2).  General Appearance: Casual  Eye Contact::  Good  Speech:  Clear and Coherent and Normal Rate  Volume:  Normal  Mood:  Reports improved mood   Affect:  Reactive, slightly irritable   Thought Process:  Intact and Linear  Orientation:  Full (Time, Place, and Person)  Thought Content:  Denies hallucinations, no delusions, not internally preoccupied   Suicidal Thoughts:  No- denies any current suicidal ideations, denies any self injurious ideations, contracts for safety   Homicidal Thoughts:  No  Memory:  Immediate;   Good Recent;   Good Remote;   Good  Judgement:  limited  Insight:  Fair  Psychomotor Activity:  Normal  Concentration:  Fair  Recall:  Good  Fund of Knowledge:Good  Language: Good  Akathisia:  No  Handed:  Right  AIMS (if indicated):     Assets:  Communication Skills Desire for Improvement Financial Resources/Insurance Housing Intimacy Leisure Time Physical Health Social Support  ADL's:  Intact  Cognition: WNL  Sleep:  Number of Hours: 5.75  Treatment Plan Summary: Daily contact with patient to assess and evaluate symptoms and progress in treatment and Medication management Continue to encourage group, milieu participation to work on coping skills and symptom reduction Continue Prozac  20 mgrs PO QDAY for depression , anxiety Continue Lamictal 25 mgrs PO QDAY for depression augmentation, mood disorder Continue Trazodone for insomnia as needed  Continue Adderall XR 10 mgrs QAM for  management of ADHD .  Treatment team working on discharge/ disposition options     Denarius Sesler  MD  05/25/2015, 2:44 PM

## 2015-05-25 NOTE — Progress Notes (Signed)
Patient ID: Tara Randall, female   DOB: June 22, 1986, 29 y.o.   MRN: [id number] D: Patient in room on approach. Pt mood and affect appeared depressed and anxious. Pt reports she is tolerating medication well. Pt denies SI/HI/AVH and pain. Pt attended and participated in evening wrap up group. Cooperative with assessment. No acute distressed noted at this time.   A: Met with pt 1:1. Medications administered as prescribed. Support and encouragement provided to attend groups and engage in milieu. Pt encouraged to discuss feelings and come to staff with any question or concerns.   R: Patient remains safe and complaint with medications.

## 2015-05-25 NOTE — [facility] Suicide Risk Assessment (Signed)
[facility] INPATIENT:  Family/Significant Other Suicide Prevention Education  Suicide Prevention Education:  Education Completed; Silvano BilisRalph Faucett, Pt's father [phone],  has been identified by the patient as the family member/significant other with whom the patient will be residing, and identified as the person(s) who will aid the patient in the event of a mental health crisis (suicidal ideations/suicide attempt).  With written consent from the patient, the family member/significant other has been provided the following suicide prevention education, prior to the and/or following the discharge of the patient.  The suicide prevention education provided includes the following:  Suicide risk factors  Suicide prevention and interventions  National Suicide Hotline telephone number  [facility] telephone number  [facility] 911  [facility] and/or [facility] telephone number  Request made of family/significant other to:  Remove weapons (e.g., guns, rifles, knives), all items previously/currently identified as safety concern.  Pt's father is removing gun from the home- giving it to his son.  Remove drugs/medications (over-the-counter, prescriptions, illicit drugs), all items previously/currently identified as a safety concern. Pt's father expressed that he will only be leaving out medication like a small bottle of Tylenol and their acid reflux medication.  The family member/significant other verbalizes understanding of the suicide prevention education information provided.  The family member/significant other agrees to remove the items of safety concern listed above.  Elaina Hoopsarter, Garen Woolbright M 05/25/2015, 9:15 AM

## 2015-05-25 NOTE — Progress Notes (Addendum)
Patient stated she had unprotected sex and will discuss pregnancy test with MD this morning.  D:  Patient's self inventory sheet, patient sleeps good, sleep medication is helpful.  Good appetite, normal energy level, good concentration.  Rated depression 2, denied hopeless and anxiety.  Denied withdrawals.  Denied SI.  Denied physical problems.  Denied pain.  Goal is to focus on her recovery vs recovery of others. "I'm not quite sure.  It seems like other patients don't acknowledge or care, that I'm overwhelmed by too much social interactions and it's hard to focus on my own situation and recovery let alone theirs."  No discharge plans.  A:  Medications administered per MD orders.  Emotional support and encouragement given patient. R:  Denied SI and HI, contracts for safety.  Denied A/V hallucinations.  Safety maintained with 15 minute checks.

## 2015-05-25 NOTE — [facility] Group Notes (Signed)
The focus of this group is to educate the patient on the purpose and policies of crisis stabilization and provide a format to answer questions about their admission.  The group details unit policies and expectations of patients while admitted.  Patient attended 0900 nurse education orientation group this morning.  Patient actively participated and appropriate affect.  Patient was alert.  Patient had appropriate insight and appropriate engagement.  Today patient will work on 3 goals for discharge.  

## 2015-05-25 NOTE — [facility] Group Notes (Signed)
[facility] LCSW Group Therapy 05/25/2015 1:15 PM  Type of Therapy: Group Therapy- Feelings about Diagnosis  Participation Level: Minimal   Participation Quality:  Irrelevant  Affect:  Flat and Irritable  Cognitive: Alert and Oriented   Insight:  Developing   Engagement in Therapy: Limited   Modes of Intervention: Clarification, Confrontation, Discussion, Education, Exploration, Limit-setting, Orientation, Problem-solving, Rapport Building, Dance movement psychotherapisteality Testing, Socialization and Support  Description of Group:   This group will allow patients to explore their thoughts and feelings about diagnoses they have received. Patients will be guided to explore their level of understanding and acceptance of these diagnoses. Facilitator will encourage patients to process their thoughts and feelings about the reactions of others to their diagnosis, and will guide patients in identifying ways to discuss their diagnosis with significant others in their lives. This group will be process-oriented, with patients participating in exploration of their own experiences as well as giving and receiving support and challenge from other group members.  Summary of Progress/Problems:  Pt only made one comment in group and it was about how people are not well enough informed about admission here. Pt continues to be irritable and minimal.  Therapeutic Modalities:   Cognitive Behavioral Therapy Solution Focused Therapy Motivational Interviewing Relapse Prevention Therapy  Chad CordialLauren Carter, LCSWA 05/25/2015 3:22 PM

## 2015-05-26 ENCOUNTER — Telehealth: Payer: Self-pay | Admitting: Nurse Practitioner

## 2015-05-26 MED ORDER — TRAZODONE HCL 50 MG PO TABS
25.0000 mg | ORAL_TABLET | Freq: Every evening | ORAL | Status: DC | PRN
  Administered 2015-05-26: 25 mg via ORAL
  Filled 2015-05-26: qty 1

## 2015-05-26 NOTE — Progress Notes (Signed)
D: Patient resting in bed with eyes closed.  Respirations even and unlabored.  Patient appears to be in no apparent distress. A: Staff to monitor Q 15 mins for safety.   R:Patient remains safe on the unit.  

## 2015-05-26 NOTE — Telephone Encounter (Signed)
Appt given per hospital request 

## 2015-05-26 NOTE — Progress Notes (Signed)
Recreation Therapy Notes  Date: 10.26.2016 Time: 9:30am Location: [address] Group room   Group Topic: Stress Management  Goal Area(s) Addresses:  Patient will actively participate in stress management techniques presented during session.   Behavioral Response: Appropriate   Intervention: Stress management techniques  Activity :  Deep Breathing and Guided Imagery. LRT provided instruction and demonstration on practice of Guided Imagery. Technique was coupled with deep breathing.   Education:  Stress Management, Discharge Planning.   Education Outcome: Acknowledges education/In group clarification offered/Needs additional education  Clinical Observations/Feedback: Patient actively engaged in technique introduced, expressed no concerns and demonstrated ability to practice independently post d/c.   Marykay Lexenise L Frederich Montilla, LRT/CTRS  Jearl KlinefelterBlanchfield, Hailee Hollick L 05/26/2015 2:53 PM

## 2015-05-26 NOTE — Progress Notes (Signed)
[facility] MD Progress Note  05/26/2015 5:35 PM Tara Randall  MRN:  [id number] Subjective:  Tara Randall states she has been dealing with a lot of stress. She is back in school, she is living with her father and three siblings in a small dwelling and she is taking care of her child his father not being in the picture. She also went trough a drastic medication change by her PCP from Effexor to Lexapro. She is now on Prozac and she feels good with it. Also admits she is having unresolved issues with her mother who just took off 3 years ago. She states she knows she needs to be in counseling trying to address these issues Principal Problem: Major depressive disorder, recurrent episode, severe, with psychotic behavior (HCC) Diagnosis:   Patient Active Problem List   Diagnosis Date Noted  . Major depressive disorder, recurrent episode, severe, with psychotic behavior (HCC) [F33.3] 05/22/2015  . Cannabis abuse [F12.10]   . Depression [F32.9] 01/26/2014  . GAD (generalized anxiety disorder) [F41.1] 01/26/2014  . ADD (attention deficit disorder) [F90.9] 01/26/2014   Total Time spent with patient: 30 minutes  Past Psychiatric History: see admission H and P  Past Medical History: History reviewed. No pertinent past medical history.  Past Surgical History  Procedure Laterality Date  . Appendectomy     Family History:  Family History  Problem Relation Age of Onset  . Depression Mother     Bi polar,manic  . Heart disease Father   . Depression Father   . ADD / ADHD Brother    Family Psychiatric  History: see admission H and P Social History:  History  Alcohol Use No     History  Drug Use No    Social History   Social History  . Marital Status: Single    Spouse Name: N/A  . Number of Children: N/A  . Years of Education: N/A   Social History Main Topics  . Smoking status: Former Games developer  . Smokeless tobacco: None  . Alcohol Use: No  . Drug Use: No  . Sexual Activity: Yes    Birth Control/  Protection: Pill   Other Topics Concern  . None   Social History Narrative   Additional Social History:                         Sleep: Fair  Appetite:  Fair  Current Medications: Current Facility-Administered Medications  Medication Dose Route Frequency Provider Last Rate Last Dose  . acetaminophen (TYLENOL) tablet 650 mg  650 mg Oral Q6H PRN Worthy Flank, NP   650 mg at 05/26/15 1514  . alum & mag hydroxide-simeth (MAALOX/MYLANTA) 200-200-20 MG/5ML suspension 30 mL  30 mL Oral Q4H PRN Worthy Flank, NP   30 mL at 05/25/15 0858  . amphetamine-dextroamphetamine (ADDERALL XR) 24 hr capsule 10 mg  10 mg Oral Daily Craige Cotta, MD   10 mg at 05/26/15 0837  . famotidine (PEPCID) tablet 20 mg  20 mg Oral Daily Worthy Flank, NP   20 mg at 05/26/15 0837  . FLUoxetine (PROZAC) capsule 20 mg  20 mg Oral Daily Craige Cotta, MD   20 mg at 05/26/15 0837  . hydrOXYzine (ATARAX/VISTARIL) tablet 25 mg  25 mg Oral TID PRN Worthy Flank, NP   25 mg at 05/23/15 2214  . lamoTRIgine (LAMICTAL) tablet 25 mg  25 mg Oral Daily Cleotis Nipper, MD   25 mg at  05/26/15 0837  . loratadine (CLARITIN) tablet 10 mg  10 mg Oral QHS Jomarie Longs, MD   10 mg at 05/25/15 2119  . magnesium hydroxide (MILK OF MAGNESIA) suspension 30 mL  30 mL Oral Daily PRN Worthy Flank, NP      . metroNIDAZOLE (FLAGYL) tablet 500 mg  500 mg Oral BID Worthy Flank, NP   500 mg at 05/26/15 1623  . Norgestimate-Ethinyl Estradiol Triphasic 0.18/0.215/0.25 MG-35 MCG tablet 1 tablet  1 tablet Oral Daily Worthy Flank, NP   1 tablet at 05/26/15 0800  . traZODone (DESYREL) tablet 25 mg  25 mg Oral QHS PRN Rachael Fee, MD        Lab Results:  Results for orders placed or performed during the hospital encounter of 05/22/15 (from the past 48 hour(s))  Pregnancy, urine     Status: None   Collection Time: 05/25/15  3:30 PM  Result Value Ref Range   Preg Test, Ur NEGATIVE NEGATIVE    Comment:        THE  SENSITIVITY OF THIS METHODOLOGY IS >20 mIU/mL. Performed at [facility]     Physical Findings: AIMS: Facial and Oral Movements Muscles of Facial Expression: None, normal Lips and Perioral Area: None, normal Jaw: None, normal Tongue: None, normal,Extremity Movements Upper (arms, wrists, hands, fingers): None, normal Lower (legs, knees, ankles, toes): None, normal, Trunk Movements Neck, shoulders, hips: None, normal, Overall Severity Severity of abnormal movements (highest score from questions above): None, normal Incapacitation due to abnormal movements: None, normal Patient's awareness of abnormal movements (rate only patient's report): No Awareness, Dental Status Current problems with teeth and/or dentures?: No Does patient usually wear dentures?: No  CIWA:  CIWA-Ar Total: 1 COWS:  COWS Total Score: 2  Musculoskeletal: Strength & Muscle Tone: within normal limits Gait & Station: normal Patient leans: normal  Psychiatric Specialty Exam: Review of Systems  Constitutional: Negative.   HENT: Negative.   Eyes: Negative.   Respiratory: Negative.   Cardiovascular: Negative.   Gastrointestinal: Negative.   Genitourinary: Negative.   Musculoskeletal: Negative.   Skin: Negative.   Neurological: Negative.   Endo/Heme/Allergies: Negative.   Psychiatric/Behavioral: Positive for depression. The patient is nervous/anxious.     Blood pressure 122/78, pulse 115, temperature 98 F (36.7 C), temperature source Oral, resp. rate 18, height  (1.651 m), weight 108.863 kg (240 lb), last menstrual period 04/22/2015.Body mass index is 39.94 kg/(m^2).  General Appearance: Fairly Groomed  Patent attorney::  Fair  Speech:  Clear and Coherent  Volume:  Normal  Mood:  Anxious  Affect:  Appropriate  Thought Process:  Coherent and Goal Directed  Orientation:  Full (Time, Place, and Person)  Thought Content:  symptoms events worries concerns  Suicidal Thoughts:  No   Homicidal Thoughts:  No  Memory:  Immediate;   Fair Recent;   Fair Remote;   Fair  Judgement:  Fair  Insight:  Present  Psychomotor Activity:  Normal  Concentration:  Fair  Recall:  [employer] of Knowledge:Fair  Language: Fair  Akathisia:  No  Handed:  Right  AIMS (if indicated):     Assets:  Desire for Improvement Housing Social Support  ADL's:  Intact  Cognition: WNL  Sleep:  Number of Hours: 5.75   Treatment Plan Summary: Daily contact with patient to assess and evaluate symptoms and progress in treatment and Medication management  Supportive approach/coping skills Depression; continue the Prozac 20 mg daily ADHD; continue the  Adderall XR 10 mg in AM Mood instability; work with the Lamictal 25 mg with plans to increase as tolerated Use CBT/mindfulness  Everette Mall A 05/26/2015, 5:35 PM

## 2015-05-26 NOTE — [facility] Group Notes (Signed)
[facility]  05/26/2015 8:45 AM  Participation Quality: Alert, Appropriate and Oriented  Mood/Affect: Appropriate  Depression Rating: 0  Anxiety Rating: 0  Thoughts of Suicide: Pt denies SI/HI  Will you contract for safety? Yes  Current AVH: Pt denies  Plan for Discharge/Comments: Pt attended discharge planning group and actively participated in group. CSW discussed suicide prevention education with the group and encouraged them to discuss discharge planning and any relevant barriers. Pt expresses that she feels much better today after a good visit with family last night. Pt was moved to another hall and reports that this has been very beneficial for her. Pt requests discharge soonn  Transportation Means: Pt reports access to transportation  Supports: No supports mentioned at this time  Chad CordialLauren Carter, Theresia MajorsLCSWA 05/26/2015 12:34 PM

## 2015-05-26 NOTE — Progress Notes (Signed)
Patient ID: Tara Randall, female   DOB: 13-Feb-1986, 29 y.o.   MRN: [id number] D   --- pt. Denies pain at this time.   She is friendly and receptive to staff and shows no behavior issues.  She said she is having a good day, has good eye contact and agrees to contract for safety.   Pt. Shows no sign of adverse effect to medications.   --- A ---  Support, mds provided.   --- R  --   Pt. Is safe on unit

## 2015-05-26 NOTE — Tx Team (Signed)
Interdisciplinary Treatment Plan Update (Adult) Date: 05/26/2015   Date: 05/26/2015 12:35 PM  Progress in Treatment:  Attending groups: Yes  Participating in groups: Yes  Taking medication as prescribed: Yes  Tolerating medication: Yes  Family/Significant othe contact made: Yes, with father Patient understands diagnosis: Yes Discussing patient identified problems/goals with staff: Yes  Medical problems stabilized or resolved: Yes  Denies suicidal/homicidal ideation: Yes Patient has not harmed self or Others: Yes   New problem(s) identified: None identified at this time.   Discharge Plan or Barriers: Pt will return home and follow-up with outpatient providers  Additional comments: n/a   Reason for Continuation of Hospitalization:  Anxiety Depression Medication stabilization Suicidal ideation  Estimated length of stay: 1-2 days  Review of initial/current patient goals per problem list:   1.  Goal(s): Patient will participate in aftercare plan  Met:  Yes  Target date: 3-5 days from date of admission   As evidenced by: Patient will participate within aftercare plan AEB aftercare provider and housing plan at discharge being identified.   05/26/15: Pt will discharge home and follow-up with outpatient services  2.  Goal (s): Patient will exhibit decreased depressive symptoms and suicidal ideations.  Met:  Yes  Target date: 3-5 days from date of admission   As evidenced by: Patient will utilize self rating of depression at 3 or below and demonstrate decreased signs of depression or be deemed stable for discharge by MD.  05/26/15: Pt rates depression at 0/10; denies SI  3.  Goal(s): Patient will demonstrate decreased signs and symptoms of anxiety.  Met:  Yes  Target date: 3-5 days from date of admission   As evidenced by: Patient will utilize self rating of anxiety at 3 or below and demonstrated decreased signs of anxiety, or be deemed stable for discharge by  MD  05/26/15: Pt rates anxiety at 0/10.  Attendees:  Patient:    Family:    Physician: Dr. Parke Poisson, MD  05/26/2015 12:35 PM  Nursing: Lars Pinks, RN Case manager  05/26/2015 12:35 PM  Clinical Social Worker Norman Clay, MSW 05/26/2015 12:35 PM  Other: Jake Bathe Liasion 05/26/2015 12:35 PM  Clinical: Loletta Specter, RN 05/26/2015 12:35 PM  Other: , RN Charge Nurse 05/26/2015 12:35 PM  Other:     Peri Maris, [place] MSW

## 2015-05-27 ENCOUNTER — Encounter (HOSPITAL_COMMUNITY): Payer: Self-pay | Admitting: Family

## 2015-05-27 MED ORDER — FLUOXETINE HCL 20 MG PO CAPS: 20.0000 mg | ORAL_CAPSULE | Freq: Every day | ORAL | Status: DC

## 2015-05-27 MED ORDER — AMPHETAMINE-DEXTROAMPHET ER 10 MG PO CP24: 10.0000 mg | ORAL_CAPSULE | Freq: Every day | ORAL | Status: DC

## 2015-05-27 MED ORDER — LORATADINE 10 MG PO TABS: 10.0000 mg | ORAL_TABLET | Freq: Every day | ORAL | Status: DC

## 2015-05-27 MED ORDER — LAMOTRIGINE 25 MG PO TABS: 25.0000 mg | ORAL_TABLET | Freq: Every day | ORAL | Status: DC

## 2015-05-27 MED ORDER — TRAZODONE HCL 50 MG PO TABS: 25.0000 mg | ORAL_TABLET | Freq: Every evening | ORAL | Status: DC | PRN

## 2015-05-27 NOTE — [facility] Group Notes (Signed)

## 2015-05-27 NOTE — Progress Notes (Signed)
Patient ID: Tara Randall, female   DOB: [birth year], 29 y.o.   MRN: [id number] Discharge D- Patient verbalizes readiness for discharge: Denies SI/HI/AVH A- Discharge instructions read and discussed with patient.  All belongings returned to patient. R- Patient was cooperative with discharge process.  Patient verbalized understanding of discharge instructions.  Signed for return of belongings. Escorted to the lobby.
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
## 2015-05-27 NOTE — Progress Notes (Signed)
D. Pt had been up and visible in milieu this evening, did attend and participate in evening group activity. Pt spoke of her day and reported it went well. Pt received all medications this evening without incident and did not verbalize any complaints of pain. A. Support and encouragement provided. R. Safety maintained, will continue to monitor.

## 2015-05-27 NOTE — Discharge Summary (Signed)
Physician Discharge Summary Note  Patient:  Tara Randall is an 29 y.o., female MRN:  [id number] DOB:  23-Jul-1986 Patient phone:  [phone] (home)  Patient address:   [address] [place] [address],  Total Time spent with patient: 30 minutes  Date of Admission:  05/22/2015 Date of Discharge: 05/27/2015  Reason for Admission:  Per HPI- Tara Gaussmily Rivero is an 29 y.o. female that [facility] for SI. Pt reports she has SI with a plan to "blow my face off with a gun." Pt stated the doctor was going to call pt's father and have gun removed from the home. Pt stated she has had worsening depression with SI for one week. She stated her meds were recently changed from Effexor to Lexapro and that the medication is not working. Pt stated she has long history of depression and has been [facility] at age 29 for suicide attempt, but reports she has had multiple attempts as a child about 6-7x. Pt has seen multiple providers for therapy and med mgnt. She currently gets her medication from [facility]CP [facility]. Pt denies HI or AVH. No delusions noted. Pt denies drug or alcohol use, but admits she was upset last night and smoked marijuana and had one beer. Pt has a 5115 month old son and her father helps her care for him. Pt reports multiple stressors like finances, having a child with no support from father's child, being in college and failing the one class she is in, and dealing with her mother leaving her entire family 3 years ago.  Principal Problem: Major depressive disorder, recurrent episode, severe, with psychotic behavior [facility](HCC) Discharge Diagnoses: Patient Active Problem List   Diagnosis Date Noted  . Major depressive disorder, recurrent episode, severe, with psychotic behavior (HCC) [F33.3] 05/22/2015    Priority: High  . Cannabis abuse [F12.10]   . Depression [F32.9] 01/26/2014  . GAD (generalized  anxiety disorder) [F41.1] 01/26/2014  . ADD (attention deficit disorder) [F90.9] 01/26/2014    Musculoskeletal: Strength & Muscle Tone: within normal limits Gait & Station: normal Patient leans: N/A  Psychiatric Specialty Exam: Physical Exam  Nursing note and vitals reviewed. Constitutional: She appears well-developed and well-nourished.  Skin: Skin is warm and dry.  Psychiatric: She has a normal mood and affect.    Review of Systems  Constitutional: Negative.   Psychiatric/Behavioral: Negative for suicidal ideas and hallucinations. Depression: stable. Nervous/anxious: sable.     Blood pressure 114/77, pulse 128, temperature 98.2 F (36.8 C), temperature source Oral, resp. rate 18, height 5\' 5"  (1.651 m), weight 108.863 kg (240 lb), last menstrual period 04/22/2015.Body mass index is 39.94 kg/(m^2).  Have you used any form of tobacco in the last 30 days? (Cigarettes, Smokeless Tobacco, Cigars, and/or Pipes): No  Has this patient used any form of tobacco in the last 30 days? (Cigarettes, Smokeless Tobacco, Cigars, and/or Pipes) Yes, A prescription for an FDA-approved tobacco cessation medication was offered at discharge and the patient refused  Past Medical History: History reviewed. No pertinent past medical history.  Past Surgical History  Procedure Laterality Date  . Appendectomy     Family History:  Family History  Problem Relation Age of Onset  . Depression Mother     Bi polar,manic  . Heart disease Father   . Depression Father   . ADD / ADHD Brother    Social History:  History  Alcohol Use No  History  Drug Use No    Social History   Social History  . Marital Status: Single    Spouse Name: N/A  . Number of Children: N/A  . Years of Education: N/A   Social History Main Topics  . Smoking status: Former Games developer  . Smokeless tobacco: None  . Alcohol Use: No  . Drug Use: No  . Sexual Activity: Yes    Birth Control/ Protection: Pill   Other Topics  Concern  . None   Social History Narrative    Risk to Self: Is patient at risk for suicide?: Yes What has been your use of drugs/alcohol within the last 12 months?: Pt reports she usually has an alcoholic beverage twice monthly; rarely consumes other substances; did have THC on 05/20/15 Risk to Others:  NO Prior Inpatient Therapy:  Yes   Prior Outpatient Therapy:  yes  Level of Care:  OP  Hospital Course:  Beverly Ferner was admitted for Major depressive disorder, recurrent episode, severe, with psychotic behavior (HCC) and crisis management.She was treated discharged with the medications listed below under Medication List.  Medical problems were identified and treated as needed.  Home medications were restarted as appropriate.  Improvement was monitored by observation and Tara Gauss daily report of symptom reduction.  Emotional and mental status was monitored by daily self-inventory reports completed by Tara Gauss and clinical staff.         Nandana Krolikowski was evaluated by the treatment team for stability and plans for continued recovery upon discharge.  Tara Gauss motivation was an integral factor for scheduling further treatment.  Employment, transportation, bed availability, health status, family support, and any pending legal issues were also considered during her hospital stay.  She was offered further treatment options upon discharge including but not limited to Residential, Intensive Outpatient, and Outpatient treatment.  Jeanifer Halliday will follow up with the services as listed below under Follow Up Information.     Upon completion of this admission the patient was both mentally and medically stable for discharge denying suicidal/homicidal ideation, auditory/visual/tactile hallucinations, delusional thoughts and paranoia.      Consults:  psychiatry  Significant Diagnostic Studies:  labs: CBC, CMP, UDS postive for amphetemines and THC.   Discharge Vitals:   Blood pressure 114/77,  pulse 128, temperature 98.2 F (36.8 C), temperature source Oral, resp. rate 18, height  (1.651 m), weight 108.863 kg (240 lb), last menstrual period 04/22/2015. Body mass index is 39.94 kg/(m^2). Lab Results:   Results for orders placed or performed during the hospital encounter of 05/22/15 (from the past 72 hour(s))  Pregnancy, urine     Status: None   Collection Time: 05/25/15  3:30 PM  Result Value Ref Range   Preg Test, Ur NEGATIVE NEGATIVE    Comment:        THE SENSITIVITY OF THIS METHODOLOGY IS >20 mIU/mL. Performed at [facility]     Physical Findings: AIMS: Facial and Oral Movements Muscles of Facial Expression: None, normal Lips and Perioral Area: None, normal Jaw: None, normal Tongue: None, normal,Extremity Movements Upper (arms, wrists, hands, fingers): None, normal Lower (legs, knees, ankles, toes): None, normal, Trunk Movements Neck, shoulders, hips: None, normal, Overall Severity Severity of abnormal movements (highest score from questions above): None, normal Incapacitation due to abnormal movements: None, normal Patient's awareness of abnormal movements (rate only patient's report): No Awareness, Dental Status Current problems with teeth and/or dentures?: No Does patient usually wear dentures?: No  CIWA:  CIWA-Ar Total: 1 COWS:  COWS Total Score: 2   See Psychiatric Specialty Exam and Suicide Risk Assessment completed by Attending Physician prior to discharge.  Discharge destination:  Home  Is patient on multiple antipsychotic therapies at discharge:  No   Has Patient had three or more failed trials of antipsychotic monotherapy by history:  No    Recommended Plan for Multiple Antipsychotic Therapies: NA      Discharge Instructions    Activity as tolerated - No restrictions    Complete by:  As directed      Diet general    Complete by:  As directed      Discharge instructions    Complete by:  As directed   Patient has been  instructed to take medications as prescribed; and report adverse effects to outpatient provider.  Follow up with primary doctor for any medical issues and If symptoms recur report to nearest emergency or crisis hot line.            Medication List    STOP taking these medications        amphetamine-dextroamphetamine 20 MG 24 hr capsule  Commonly known as:  ADDERALL XR  Replaced by:  amphetamine-dextroamphetamine 10 MG 24 hr capsule     diphenhydramine-acetaminophen 25-500 MG Tabs tablet  Commonly known as:  TYLENOL PM     escitalopram 10 MG tablet  Commonly known as:  LEXAPRO     ibuprofen 200 MG tablet  Commonly known as:  ADVIL,MOTRIN     MELATONIN PO      TAKE these medications      Indication   amphetamine-dextroamphetamine 10 MG 24 hr capsule  Commonly known as:  ADDERALL XR  Take 1 capsule (10 mg total) by mouth daily.      famotidine 20 MG tablet  Commonly known as:  PEPCID  Take 20 mg by mouth daily.      FLUoxetine 20 MG capsule  Commonly known as:  PROZAC  Take 1 capsule (20 mg total) by mouth daily.   Indication:  Depression     lamoTRIgine 25 MG tablet  Commonly known as:  LAMICTAL  Take 1 tablet (25 mg total) by mouth daily.   Indication:  mood stabilization     loratadine 10 MG tablet  Commonly known as:  CLARITIN  Take 1 tablet (10 mg total) by mouth at bedtime.   Indication:  Hayfever     metroNIDAZOLE 500 MG tablet  Commonly known as:  FLAGYL  Take 1 tablet (500 mg total) by mouth 2 (two) times daily. One po bid x 7 days      traZODone 50 MG tablet  Commonly known as:  DESYREL  Take 0.5 tablets (25 mg total) by mouth at bedtime as needed for sleep.   Indication:  Trouble Sleeping     TRI-SPRINTEC 0.18/0.215/0.25 MG-35 MCG tablet  Generic drug:  Norgestimate-Ethinyl Estradiol Triphasic  TAKE ONE TABLET BY MOUTH ONCE DAILY        Follow-up Information    Follow up with [facility] On 05/31/2015.   Why:  at 12:00pm for your first  therapy appointment.   Contact information:   [address] [place] [address] [phone]       [facility] with [facility]  On 06/02/2015.   Why:  at 11:30am with Dr. Oswaldo Done for medication management.   Contact information:   [address] [place], [place] [address] Phone: [phone] Fax: [phone]  Follow-up recommendations:  Activity:  as tolerated Diet:  heart healthy  Comments:  Safety, stabilization, and risk of access to medication and medication stabilization Take all of you medications as prescribed by your mental healthcare provider.  Report any adverse effects and reactions from your medications to your outpatient provider promptly. Do not engage in alcohol and or illegal drug use while on prescription medicines. In the event of worsening symptoms call the crisis hotline, 911, and or go to the nearest emergency department for appropriate evaluation and treatment of symptoms. Follow-up with your primary care provider for your medical issues, concerns and or health care needs.   Keep all scheduled appointments.  If you are unable to keep an appointment call to reschedule.  Let the nurse know if you will need medications before next scheduled appointment.  Total Discharge Time: 30 minutes   Signed: Oneta Rack FNP- [facility] 05/27/2015, 5:34 PM  I personally assessed the patient and formulated the plan Madie Reno A. Dub Mikes, M.D.

## 2015-05-27 NOTE — Progress Notes (Signed)
  [facility] Adult Case Management Discharge Plan :  Will you be returning to the same living situation after discharge:  Yes,  home At discharge, do you have transportation home?: Yes,  family Do you have the ability to pay for your medications: Yes,  mental health  Release of information consent forms completed and submitted to medical records by CSW.  Patient to Follow up at: Follow-up Information    [facility] up [facility] On 05/31/2015.   Why:  at 12:00pm for your first therapy appointment.   Contact information:   [address] [place] [address] [phone]       [facility]  On 06/02/2015.   Why:  at 11:30am with Dr. Oswaldo DoneVincent for medication management.   Contact information:   [address] [place], [place] [address] Phone: [phone] Fax: [phone]      Patient denies SI/HI: Yes,  during group/self report.     Safety Planning and Suicide Prevention discussed: Yes,  SPE completed with pt's father. SPI pamphlet and mobile crisis information provided to pt and she was encouraged to share this with her supports.   Have you used any form of tobacco in the last 30 days? (Cigarettes, Smokeless Tobacco, Cigars, and/or Pipes): No  Has patient been referred to the Quitline?: N/A patient is not a smoker  Smart, Avery DennisonHeather LCSWA  05/27/2015, 11:01 AM

## 2015-06-02 ENCOUNTER — Ambulatory Visit (INDEPENDENT_AMBULATORY_CARE_PROVIDER_SITE_OTHER): Payer: Medicaid Other | Admitting: Pediatrics

## 2015-06-02 ENCOUNTER — Encounter: Payer: Self-pay | Admitting: Pediatrics

## 2015-06-02 VITALS — BP 118/83 | HR 83 | Temp 99.0°F | Ht 65.0 in | Wt 242.0 lb

## 2015-06-02 DIAGNOSIS — F1210 Cannabis abuse, uncomplicated: Secondary | ICD-10-CM

## 2015-06-02 DIAGNOSIS — F329 Major depressive disorder, single episode, unspecified: Secondary | ICD-10-CM

## 2015-06-02 DIAGNOSIS — F411 Generalized anxiety disorder: Secondary | ICD-10-CM | POA: Diagnosis not present

## 2015-06-02 DIAGNOSIS — F909 Attention-deficit hyperactivity disorder, unspecified type: Secondary | ICD-10-CM | POA: Diagnosis not present

## 2015-06-02 DIAGNOSIS — F988 Other specified behavioral and emotional disorders with onset usually occurring in childhood and adolescence: Secondary | ICD-10-CM

## 2015-06-02 DIAGNOSIS — F32A Depression, unspecified: Secondary | ICD-10-CM

## 2015-06-02 MED ORDER — AMPHETAMINE-DEXTROAMPHET ER 10 MG PO CP24: 10.0000 mg | ORAL_CAPSULE | Freq: Two times a day (BID) | ORAL | Status: DC

## 2015-06-02 NOTE — Progress Notes (Signed)
Subjective:    Patient ID: Tara Randall, female    DOB: Sep 06, 1985, 29 y.o.   MRN: [id number]  CC: follow up hospital discharge  HPI: Tara Randall is a 29 y.o. female presenting on 06/02/2015 for Hospitalization Follow-up  Significantly better since being home from the hospital. Coping skills going well, using them when things seem overwhelming Saw a therapist day before yesterday Going once a week, went well Started Lamictal, on birth control. Not currently sexually. Continues to take adderall. Says 20mg  XR in the morning seems to be too much, makes her feel anxious but 10mg  XR doesn't last long enough throughout day, pt with Tara RalphParker her son in the morning, doing online classes at night and has homework to catch up on.  No thoughts of hurting herself or anyone else Gun is at her brothers house, her father is in charge of her medicines.   Relevant past medical, surgical, family and social history reviewed and updated as indicated. Interim medical history since our last visit reviewed. Allergies and medications reviewed and updated.   ROS: Per HPI unless specifically indicated above  Past Medical History Patient Active Problem List   Diagnosis Date Noted  . Major depressive disorder, recurrent episode, severe, with psychotic behavior (HCC) 05/22/2015  . Cannabis abuse   . Depression 01/26/2014  . GAD (generalized anxiety disorder) 01/26/2014  . ADD (attention deficit disorder) 01/26/2014    Current Outpatient Prescriptions  Medication Sig Dispense Refill  . amphetamine-dextroamphetamine (ADDERALL XR) 10 MG 24 hr capsule Take 1 capsule (10 mg total) by mouth 2 (two) times daily. 60 capsule 0  . famotidine (PEPCID) 20 MG tablet Take 20 mg by mouth daily.    Marland Kitchen. FLUoxetine (PROZAC) 20 MG capsule Take 1 capsule (20 mg total) by mouth daily. 30 capsule 0  . lamoTRIgine (LAMICTAL) 25 MG tablet Take 1 tablet (25 mg total) by mouth daily. 30 tablet 0  . loratadine (CLARITIN) 10 MG  tablet Take 1 tablet (10 mg total) by mouth at bedtime. 30 tablet 0  . traZODone (DESYREL) 50 MG tablet Take 0.5 tablets (25 mg total) by mouth at bedtime as needed for sleep. 15 tablet 0  . TRI-SPRINTEC 0.18/0.215/0.25 MG-35 MCG tablet TAKE ONE TABLET BY MOUTH ONCE DAILY 28 tablet 2   No current facility-administered medications for this visit.       Objective:    BP 118/83 mmHg  Pulse 83  Temp(Src) 99 F (37.2 C) (Oral)  Ht 5\' 5"  (1.651 m)  Wt 242 lb (109.77 kg)  BMI 40.27 kg/m2  LMP 04/22/2015  Wt Readings from Last 3 Encounters:  06/02/15 242 lb (109.77 kg)  05/22/15 240 lb (108.863 kg)  05/21/15 242 lb (109.77 kg)    Gen: NAD, alert, cooperative with exam, NCAT EYES: EOMI, no scleral injection or icterus CV: NRRR, normal S1/S2, no murmur, distal pulses 2+ b/l Resp: CTABL, no wheezes, normal WOB Abd: +BS, soft, NTND. no guarding or organomegaly Ext: No edema, warm Neuro: Alert and oriented, strength equal b/l UE and LE, coordination grossly normal MSK: normal muscle bulk Psych: normal affect, no SI/HI, normal stream of thoughts and thought content     Assessment & Plan:   Tara Burtonmily was seen today for hospitalization follow-up. I have reviewed her hospital records.  Diagnoses and all orders for this visit:  GAD (generalized anxiety disorder) Coping skills helping. Continue to follow up with therapist.  Depression Improved symptoms. No Si/HI now. Started fluoxetine and lamictal. RTC in 4 weeks  for reassessment. Continue to see therapist. Will let me know if symptoms worsen.  ADD (attention deficit disorder) Will try  XR BID to see if helps with homework at night. With son Tara Ralph during the day in th emorning, also discussed could try not taking her dose until noon to see if lasts longer. Pt wants to continue to take it in th emorning for now as well. Will continue to discuss and assess need. Father is keepin glal of her medicines, out of reach of Randall. -      amphetamine-dextroamphetamine (ADDERALL XR) 10 MG 24 hr capsule; Take 1 capsule (10 mg total) by mouth 2 (two) times daily.  H/o Cannabis abuse In hospital discussed tryign to DC adderall given positive utox but then decided she was better to continue it. Has not smoked since discharge. I discussed with patient she will need a utox at next visit that needs to be clean if I am to continue to prescribe the adderall as it is a controlled substance. Pt aware and expresses understanding.   Follow up plan: Return in about 4 weeks (around 06/30/2015).  Rex Kras, MD [facility] 06/02/2015, 12:06 PM

## 2015-06-21 ENCOUNTER — Telehealth: Payer: Self-pay | Admitting: Pediatrics

## 2015-06-21 DIAGNOSIS — J309 Allergic rhinitis, unspecified: Secondary | ICD-10-CM

## 2015-06-21 DIAGNOSIS — G4700 Insomnia, unspecified: Secondary | ICD-10-CM

## 2015-06-21 DIAGNOSIS — F32A Depression, unspecified: Secondary | ICD-10-CM

## 2015-06-21 DIAGNOSIS — F329 Major depressive disorder, single episode, unspecified: Secondary | ICD-10-CM

## 2015-06-21 DIAGNOSIS — F411 Generalized anxiety disorder: Secondary | ICD-10-CM

## 2015-06-22 MED ORDER — FLUOXETINE HCL 20 MG PO CAPS: 20.0000 mg | ORAL_CAPSULE | Freq: Every day | ORAL | Status: DC

## 2015-06-22 MED ORDER — LAMOTRIGINE 25 MG PO TABS: 25.0000 mg | ORAL_TABLET | Freq: Every day | ORAL | Status: DC

## 2015-06-22 MED ORDER — TRAZODONE HCL 50 MG PO TABS: 25.0000 mg | ORAL_TABLET | Freq: Every evening | ORAL | Status: DC | PRN

## 2015-06-22 MED ORDER — LORATADINE 10 MG PO TABS: 10.0000 mg | ORAL_TABLET | Freq: Every day | ORAL | Status: DC

## 2015-06-22 NOTE — Telephone Encounter (Signed)
Left detailed message that refills were sent to pharmacy as requested and to [facility] with any further questions or concerns.

## 2015-06-22 NOTE — Telephone Encounter (Signed)
Rx refill sent in.

## 2015-07-05 ENCOUNTER — Encounter: Payer: Self-pay | Admitting: Pediatrics

## 2015-07-05 ENCOUNTER — Ambulatory Visit (INDEPENDENT_AMBULATORY_CARE_PROVIDER_SITE_OTHER): Payer: Medicaid Other | Admitting: Pediatrics

## 2015-07-05 VITALS — BP 111/61 | HR 86 | Temp 98.4°F | Ht 65.0 in | Wt 241.4 lb

## 2015-07-05 DIAGNOSIS — L700 Acne vulgaris: Secondary | ICD-10-CM | POA: Diagnosis not present

## 2015-07-05 DIAGNOSIS — F32A Depression, unspecified: Secondary | ICD-10-CM

## 2015-07-05 DIAGNOSIS — F411 Generalized anxiety disorder: Secondary | ICD-10-CM | POA: Diagnosis not present

## 2015-07-05 DIAGNOSIS — Z79899 Other long term (current) drug therapy: Secondary | ICD-10-CM

## 2015-07-05 DIAGNOSIS — Z23 Encounter for immunization: Secondary | ICD-10-CM | POA: Diagnosis not present

## 2015-07-05 DIAGNOSIS — Z6841 Body Mass Index (BMI) 40.0 and over, adult: Secondary | ICD-10-CM | POA: Diagnosis not present

## 2015-07-05 DIAGNOSIS — F329 Major depressive disorder, single episode, unspecified: Secondary | ICD-10-CM | POA: Diagnosis not present

## 2015-07-05 DIAGNOSIS — F988 Other specified behavioral and emotional disorders with onset usually occurring in childhood and adolescence: Secondary | ICD-10-CM

## 2015-07-05 DIAGNOSIS — F909 Attention-deficit hyperactivity disorder, unspecified type: Secondary | ICD-10-CM

## 2015-07-05 MED ORDER — AMPHETAMINE-DEXTROAMPHET ER 10 MG PO CP24: 10.0000 mg | ORAL_CAPSULE | Freq: Two times a day (BID) | ORAL | Status: DC

## 2015-07-05 MED ORDER — CLINDAMYCIN PHOS-BENZOYL PEROX 1-5 % EX GEL: Freq: Two times a day (BID) | CUTANEOUS | Status: DC

## 2015-07-05 NOTE — Progress Notes (Signed)
Subjective:    Patient ID: Tara Randall, female    DOB: [birth year], 29 y.o.   MRN: [id number]  CC: Follow-up anxiety, depression  HPI: Tara Randall is a 29 y.o. female presenting for Follow-up  Mood much better Seeing therapist regularly [name] doing well too Things at home going well Taking her own medicines Feels compfrtable with it Stopped adderall for a few days, felt like she was taking longer in school, taking longer than what she should to do her homework Oral presentation recently, nervous but did well on it Taking a math class now. Not sure if she will pass given difficulties in beginning of semester but has a good attitude about it, doing very well on exams now.  Breaking out more, was using clindamycin on acne spots on her face    Depression screen [facility] 2/9 07/05/2015 06/02/2015 05/21/2015 03/31/2015 02/17/2015  Decreased Interest 0 1 1 1 3   Down, Depressed, Hopeless 0 1 1 1 3   PHQ - 2 Score 0 2 2 2 6   Altered sleeping 0 3 1 1 2   Tired, decreased energy 0 0 1 1 3   Change in appetite 0 0 1 1 2   Feeling bad or failure about yourself  0 2 1 1 3   Trouble concentrating 0 1 1 1 3   Moving slowly or fidgety/restless 0 0 1 1 0  Suicidal thoughts 0 0 1 0 0  PHQ-9 Score 0 8 9 8 19   Difficult doing work/chores - Somewhat difficult - - -     Relevant past medical, surgical, family and social history reviewed and updated as indicated. Interim medical history since our last visit reviewed. Allergies and medications reviewed and updated.    ROS: Per HPI unless specifically indicated above  History  Smoking status  . Former Smoker  Smokeless tobacco  . Not on file    Past Medical History Patient Active Problem List   Diagnosis Date Noted  . Major depressive disorder, recurrent episode, severe, with psychotic behavior (HCC) 05/22/2015  . Cannabis abuse   . Depression 01/26/2014  . GAD (generalized anxiety disorder) 01/26/2014  . ADD (attention deficit disorder)  01/26/2014    Current Outpatient Prescriptions  Medication Sig Dispense Refill  . amphetamine-dextroamphetamine (ADDERALL XR) 10 MG 24 hr capsule Take 1 capsule (10 mg total) by mouth 2 (two) times daily. 60 capsule 0  . famotidine (PEPCID) 20 MG tablet Take 20 mg by mouth daily.    Marland Kitchen. FLUoxetine (PROZAC) 20 MG capsule Take 1 capsule (20 mg total) by mouth daily. 30 capsule 1  . lamoTRIgine (LAMICTAL) 25 MG tablet Take 1 tablet (25 mg total) by mouth daily. 30 tablet 1  . loratadine (CLARITIN) 10 MG tablet Take 1 tablet (10 mg total) by mouth at bedtime. 30 tablet 1  . traZODone (DESYREL) 50 MG tablet Take 0.5 tablets (25 mg total) by mouth at bedtime as needed for sleep. 15 tablet 1  . TRI-SPRINTEC 0.18/0.215/0.25 MG-35 MCG tablet TAKE ONE TABLET BY MOUTH ONCE DAILY 28 tablet 2  . clindamycin-benzoyl peroxide (BENZACLIN) gel Apply topically 2 (two) times daily. 25 g 3   No current facility-administered medications for this visit.       Objective:    BP 111/61 mmHg  Pulse 86  Temp(Src) 98.4 F (36.9 C) (Oral)  Ht 5\' 5"  (1.651 m)  Wt 241 lb 6.4 oz (109.498 kg)  BMI 40.17 kg/m2  Wt Readings from Last 3 Encounters:  07/05/15 241 lb 6.4 oz (109.498  kg)  06/02/15 242 lb (109.77 kg)  05/22/15 240 lb (108.863 kg)     Gen: NAD, alert, cooperative with exam, NCAT EYES: EOMI, no scleral injection or icterus ENT:   OP without erythema LYMPH: no cervical LAD CV: NRRR, normal S1/S2, no murmur, distal pulses 2+ b/l Resp: CTABL, no wheezes, normal WOB Abd: +BS, soft, NTND. no guarding or organomegaly Ext: No edema, warm Neuro: Alert and oriented, strength equal b/l UE and LE, coordination grossly normal MSK: normal muscle bulk Psych: full affect, well-dressed and groomed, normal thoughts, denies thoughts of hurting herself or others. Skin: a few scattered erythematous papules, tender, 1-82mm on face     Assessment & Plan:    [name] was seen today for follow-up of multiple medical  problems.  Diagnoses and all orders for this visit:  Acne vulgaris Will do trial of below, similar has worked for her in the past. With a few inflamed papules today.  -     clindamycin-benzoyl peroxide (BENZACLIN) gel; Apply topically 2 (two) times daily.  ADD (attention deficit disorder) Discussed controlled substance agreement. Adderall does seem to help her get thorugh her day and school and homework, she is benefiting from it at this point. Does not seem to worsen her anxiety/depression per her symptom review. Will continue BID adderall . Gave 3 printed Rx dated 07/05/2015, 08/05/2015, 09/05/2015, each with #60 pills. Discussed will not be able to replace lost or stolen scripts or medicines. Need to maintain clean urine drug screen, will have to stop prescribing if unprescribed substances including marijuana are on screen or if her prescribed medicine is not on the screen. -     amphetamine-dextroamphetamine (ADDERALL XR) 10 MG 24 hr capsule; Take 1 capsule (10 mg total) by mouth 2 (two) times daily.  Anxiety and depression PHQ 9 score 0, much improved from last couple of visits. Symptoms much improved, pt reports doing well overall, mood much better. No SI or HI, getting along well with her family. Planning on moving soon. Pt on fluoxetine and lamictal. No side effects. On OCP and not currently sexually active. Discussed importance of talking with health care provider before trying to conceive in the future. Pt voiced understanding.  Controlled substance agreement signed -     ToxASSURE Select 13 (MW), Urine  Encounter for immunization Flu shot today  Other orders -     Flu Vaccine QUAD 36+ mos IM  Follow up plan: Return in about 3 months (around 09/29/2015).  Rex Kras, MD [facility] 07/05/2015, 11:51 AM

## 2015-07-06 DIAGNOSIS — Z6841 Body Mass Index (BMI) 40.0 and over, adult: Secondary | ICD-10-CM | POA: Insufficient documentation

## 2015-07-09 LAB — TOXASSURE SELECT 13 (MW), URINE: PDF: 0

## 2015-07-13 ENCOUNTER — Telehealth: Payer: Self-pay

## 2015-07-13 DIAGNOSIS — L700 Acne vulgaris: Secondary | ICD-10-CM

## 2015-07-13 NOTE — Telephone Encounter (Signed)
Medicaid non preferred Clindamycin Phos-Benzoyl Perox. Preferred are Azelex cream, Benazaclin Gel, Clindanycin phsophate gel, lotion or solutions, Differin cream, gel or lotion and Tretinoin cream gel

## 2015-07-14 MED ORDER — BENZACLIN 1-5 % EX GEL: Freq: Two times a day (BID) | CUTANEOUS | Status: DC

## 2015-07-14 NOTE — Telephone Encounter (Signed)
Sent in order for brand name benzaclin gel.

## 2015-08-24 ENCOUNTER — Other Ambulatory Visit: Payer: Self-pay | Admitting: Nurse Practitioner

## 2015-08-28 ENCOUNTER — Other Ambulatory Visit: Payer: Self-pay | Admitting: Pediatrics

## 2015-08-30 NOTE — Telephone Encounter (Signed)
Last seen 07/05/15  Dr Vincent 

## 2015-10-04 ENCOUNTER — Encounter: Payer: Self-pay | Admitting: Pediatrics

## 2015-10-04 ENCOUNTER — Ambulatory Visit (INDEPENDENT_AMBULATORY_CARE_PROVIDER_SITE_OTHER): Payer: Medicaid Other | Admitting: Pediatrics

## 2015-10-04 VITALS — BP 110/71 | HR 94 | Temp 98.7°F | Ht 65.0 in | Wt 237.2 lb

## 2015-10-04 DIAGNOSIS — F32A Depression, unspecified: Secondary | ICD-10-CM

## 2015-10-04 DIAGNOSIS — F411 Generalized anxiety disorder: Secondary | ICD-10-CM | POA: Diagnosis not present

## 2015-10-04 DIAGNOSIS — Z79899 Other long term (current) drug therapy: Secondary | ICD-10-CM | POA: Diagnosis not present

## 2015-10-04 DIAGNOSIS — K219 Gastro-esophageal reflux disease without esophagitis: Secondary | ICD-10-CM | POA: Diagnosis not present

## 2015-10-04 DIAGNOSIS — F329 Major depressive disorder, single episode, unspecified: Secondary | ICD-10-CM

## 2015-10-04 DIAGNOSIS — Z309 Encounter for contraceptive management, unspecified: Secondary | ICD-10-CM | POA: Diagnosis not present

## 2015-10-04 DIAGNOSIS — F988 Other specified behavioral and emotional disorders with onset usually occurring in childhood and adolescence: Secondary | ICD-10-CM

## 2015-10-04 DIAGNOSIS — F909 Attention-deficit hyperactivity disorder, unspecified type: Secondary | ICD-10-CM | POA: Diagnosis not present

## 2015-10-04 MED ORDER — AMPHETAMINE-DEXTROAMPHET ER 10 MG PO CP24: 10.0000 mg | ORAL_CAPSULE | Freq: Two times a day (BID) | ORAL | Status: DC

## 2015-10-04 MED ORDER — NORGESTIM-ETH ESTRAD TRIPHASIC 0.18/0.215/0.25 MG-35 MCG PO TABS: 1.0000 | ORAL_TABLET | Freq: Every day | ORAL | Status: DC

## 2015-10-04 MED ORDER — TRAZODONE HCL 50 MG PO TABS: ORAL_TABLET | ORAL | Status: DC

## 2015-10-04 MED ORDER — LAMOTRIGINE 25 MG PO TABS: 25.0000 mg | ORAL_TABLET | Freq: Every day | ORAL | Status: AC

## 2015-10-04 MED ORDER — LAMOTRIGINE 25 MG PO TABS: 25.0000 mg | ORAL_TABLET | Freq: Every day | ORAL | Status: DC

## 2015-10-04 MED ORDER — FAMOTIDINE 20 MG PO TABS: 20.0000 mg | ORAL_TABLET | Freq: Every day | ORAL | Status: AC

## 2015-10-04 MED ORDER — FLUOXETINE HCL 40 MG PO CAPS: 40.0000 mg | ORAL_CAPSULE | Freq: Every day | ORAL | Status: AC

## 2015-10-04 MED ORDER — TRAZODONE HCL 50 MG PO TABS: ORAL_TABLET | ORAL | Status: AC

## 2015-10-04 MED ORDER — FAMOTIDINE 20 MG PO TABS: 20.0000 mg | ORAL_TABLET | Freq: Every day | ORAL | Status: DC

## 2015-10-04 MED ORDER — FLUOXETINE HCL 40 MG PO CAPS: 40.0000 mg | ORAL_CAPSULE | Freq: Every day | ORAL | Status: DC

## 2015-10-04 NOTE — Progress Notes (Signed)
Subjective:    Patient ID: Tara Randall, female    DOB: 06-19-1986, 30 y.o.   MRN: [id number]  CC: Medication Refill and Cyst   HPI: Tara Randall is a 30 y.o. female presenting for Medication Refill and Cyst  Doing well overall, still feeling hopeless every 3-5 mo, when she thinks about future, worried about health insurance, moving to [facility], dealing with landlord Working at [employer] Still seeing counselor once a week When she doesn't have adderall notices she can't remember as well, has trouble working  Noticed small soft area that is tender R anterior shin   Depression screen [facility] 2/9 10/04/2015 07/05/2015 06/02/2015 05/21/2015 03/31/2015  Decreased Interest 1 0 Down, Depressed, Hopeless 1 0 PHQ - 2 Score 2 0 Altered sleeping 0 0 Tired, decreased energy 1 0 0 1 1  Change in appetite 0 0 0 1 1  Feeling bad or failure about yourself  1 0 Trouble concentrating 1 0 Moving slowly or fidgety/restless 0 0 0 1 1  Suicidal thoughts 0 0 0 1 0  PHQ-9 Score 5 0 Difficult doing work/chores - - Somewhat difficult - -     Relevant past medical, surgical, family and social history reviewed and updated as indicated. Interim medical history since our last visit reviewed. Allergies and medications reviewed and updated.    ROS: Per HPI unless specifically indicated above  History  Smoking status  . Former Smoker  Smokeless tobacco  . Not on file    Past Medical History Patient Active Problem List   Diagnosis Date Noted  . BMI 40.0-44.9, adult (HCC) 07/06/2015  . Major depressive disorder, recurrent episode, severe, with psychotic behavior (HCC) 05/22/2015  . Cannabis abuse   . Depression 01/26/2014  . GAD (generalized anxiety disorder) 01/26/2014  . ADD (attention deficit disorder) 01/26/2014       Objective:    BP 110/71 mmHg  Pulse 94  Temp(Src) 98.7 F (37.1 C) (Oral)  Ht  (1.651 m)  Wt 237 lb 3.2 oz (107.593 kg)   BMI 39.47 kg/m2  LMP 09/20/2015  Wt Readings from Last 3 Encounters:  10/04/15 237 lb 3.2 oz (107.593 kg)  07/05/15 241 lb 6.4 oz (109.498 kg)  06/02/15 242 lb (109.77 kg)    Gen: NAD, alert, cooperative with exam, NCAT EYES: EOMI, no scleral injection or icterus ENT:   OP without erythema LYMPH: no cervical LAD CV: NRRR, normal S1/S2, no murmur, distal pulses 2+ b/l Resp: CTABL, no wheezes, normal WOB Abd: +BS, soft, NTND. Ext: No edema, warm Neuro: Alert and oriented, strength equal b/l UE and LE, coordination grossly normal MSK: normal muscle bulk Psych: full affect, no thoughts of self harm     Assessment & Plan:    [name] was seen today for medication refill and cyst.  Diagnoses and all orders for this visit:  ADD (attention deficit disorder) Symptoms improved on adderall. Will continue for now. Toxassure today. Gave 3 Rx for below, last dated 11/29/2015. -     amphetamine-dextroamphetamine (ADDERALL XR) 10 MG 24 hr capsule; Take 1 capsule (10 mg total) by mouth 2 (two) times daily.  Depression Recently mood down again though symptoms much better than last fall still. No thoughts of self harm. Increase fluoxetine to . If needed can increase trazodone. -     FLUoxetine (  PROZAC) 40 MG capsule; Take 1 capsule (40 mg total) by mouth daily. -     lamoTRIgine (LAMICTAL) 25 MG tablet; Take 1 tablet (25 mg total) by mouth daily.  GAD (generalized anxiety disorder) Symptoms improving, continue med. -     traZODone (DESYREL) 50 MG tablet; TAKE ONE-HALF TABLET BY MOUTH AT BEDTIME AS NEEDED FOR SLEEP  High risk medication use -     ToxASSURE Select 13 (MW), Urine  Gastroesophageal reflux disease, esophagitis presence not specified Continue below,  Symptoms improved -     famotidine (PEPCID) 20 MG tablet; Take 1 tablet (20 mg total) by mouth daily.  Encounter for contraceptive management, unspecified encounter Continue below -     Norgestimate-Ethinyl Estradiol Triphasic  (TRI-SPRINTEC) 0.18/0.215/0.25 MG-35 MCG tablet; Take 1 tablet by mouth daily.    Follow up plan: Return in about 3 months (around 01/04/2016) for ADHD med refill.  Tara Krasarol Vincent, MD [facility] 10/04/2015, 12:48 PM

## 2015-10-09 LAB — TOXASSURE SELECT 13 (MW), URINE: PDF: 0

## 2016-01-04 ENCOUNTER — Encounter: Payer: Self-pay | Admitting: Family Medicine

## 2016-01-04 ENCOUNTER — Ambulatory Visit (INDEPENDENT_AMBULATORY_CARE_PROVIDER_SITE_OTHER): Payer: Medicaid Other | Admitting: Family Medicine

## 2016-01-04 VITALS — BP 127/87 | HR 90 | Temp 98.9°F | Ht 65.0 in | Wt 233.2 lb

## 2016-01-04 DIAGNOSIS — Z6841 Body Mass Index (BMI) 40.0 and over, adult: Secondary | ICD-10-CM

## 2016-01-04 DIAGNOSIS — F909 Attention-deficit hyperactivity disorder, unspecified type: Secondary | ICD-10-CM | POA: Diagnosis not present

## 2016-01-04 DIAGNOSIS — F329 Major depressive disorder, single episode, unspecified: Secondary | ICD-10-CM | POA: Diagnosis not present

## 2016-01-04 DIAGNOSIS — F988 Other specified behavioral and emotional disorders with onset usually occurring in childhood and adolescence: Secondary | ICD-10-CM

## 2016-01-04 DIAGNOSIS — F32A Depression, unspecified: Secondary | ICD-10-CM

## 2016-01-04 MED ORDER — AMPHETAMINE-DEXTROAMPHET ER 10 MG PO CP24: 10.0000 mg | ORAL_CAPSULE | Freq: Two times a day (BID) | ORAL | Status: DC

## 2016-01-04 NOTE — Progress Notes (Signed)
   HPI  Patient presents today here for follow-up ADHD, depression, and obesity.  ADHD Doing well with medications .  Depression Doing very well with increased fluoxetine Denies suicidal ideation. States that she still going to therapy, she's identified several light stressors Sleeping very well with the use of trazodone  Obesity She is down 4 pounds since her last visit, she is very pleased with this  She denies any current substance abuse  PMH: Smoking status noted ROS: Per HPI  Objective: BP 127/87 mmHg  Pulse 90  Temp(Src) 98.9 F (37.2 C) (Oral)  Ht 5\' 5"  (1.651 m)  Wt 233 lb 3.2 oz (105.779 kg)  BMI 38.81 kg/m2  LMP 12/21/2015 Gen: NAD, alert, cooperative with exam HEENT: NCAT CV: RRR, good S1/S2, no murmur Resp: CTABL, no wheezes, non-labored Ext: No edema, warm Neuro: Alert and oriented, No gross deficits  Assessment and plan:  # ADD, ADHD Refilled Adderall 3 months prescriptions She's doing well medications with no obvious side effects  # Depression Doing very well with Prozac Encouraged to continue counseling Return to clinic in 3 months  # Obesity Down 4 pounds since last visit, congratulated   Meds ordered this encounter  Medications  . amphetamine-dextroamphetamine (ADDERALL XR) 10 MG 24 hr capsule    Sig: Take 1 capsule (10 mg total) by mouth 2 (two) times daily.    Dispense:  60 capsule    Refill:  0  . amphetamine-dextroamphetamine (ADDERALL XR) 10 MG 24 hr capsule    Sig: Take 1 capsule (10 mg total) by mouth 2 (two) times daily.    Dispense:  60 capsule    Refill:  0    Please do not fill until 02/03/16.  Marland Kitchen. amphetamine-dextroamphetamine (ADDERALL XR) 10 MG 24 hr capsule    Sig: Take 1 capsule (10 mg total) by mouth 2 (two) times daily.    Dispense:  60 capsule    Refill:  0    Please do not fill until 03/05/16.    Murtis SinkSam Fama Muenchow, MD [facility] 01/04/2016, 1:44 PM

## 2016-01-04 NOTE — Patient Instructions (Signed)
Great to meet you!  I am glad the medicines are helping you!  Please come back in 3 months for refill and follow up

## 2016-04-04 ENCOUNTER — Telehealth: Payer: Self-pay | Admitting: *Deleted

## 2016-04-04 NOTE — Telephone Encounter (Signed)
Left message for patient to call back to reschedule appt for 04/05/16 due to Dr. Oswaldo DoneVincent being out of office

## 2016-04-05 ENCOUNTER — Ambulatory Visit: Payer: Medicaid Other | Admitting: Pediatrics

## 2016-04-10 ENCOUNTER — Encounter: Payer: Self-pay | Admitting: Pediatrics

## 2016-04-10 ENCOUNTER — Ambulatory Visit (INDEPENDENT_AMBULATORY_CARE_PROVIDER_SITE_OTHER): Payer: Medicaid Other | Admitting: Pediatrics

## 2016-04-10 VITALS — BP 117/70 | HR 81 | Temp 98.3°F | Ht 65.0 in | Wt 233.6 lb

## 2016-04-10 DIAGNOSIS — F909 Attention-deficit hyperactivity disorder, unspecified type: Secondary | ICD-10-CM | POA: Diagnosis not present

## 2016-04-10 DIAGNOSIS — F333 Major depressive disorder, recurrent, severe with psychotic symptoms: Secondary | ICD-10-CM

## 2016-04-10 DIAGNOSIS — F988 Other specified behavioral and emotional disorders with onset usually occurring in childhood and adolescence: Secondary | ICD-10-CM

## 2016-04-10 MED ORDER — AMPHETAMINE-DEXTROAMPHET ER 10 MG PO CP24: 10.0000 mg | ORAL_CAPSULE | Freq: Two times a day (BID) | ORAL | 0 refills | Status: DC

## 2016-04-10 MED ORDER — AMPHETAMINE-DEXTROAMPHET ER 10 MG PO CP24: 10.0000 mg | ORAL_CAPSULE | Freq: Two times a day (BID) | ORAL | 0 refills | Status: AC

## 2016-04-10 NOTE — Progress Notes (Signed)
Subjective:   Patient ID: Tara Randall, female    DOB: 13-Feb-1986, 30 y.o.   MRN: [id number] CC: Follow-up depression, ADHD  HPI: Tara Randall is a 30 y.o. 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 He doesn't have a diagnosis but she says he doesn't play with other kids and she is worried that there might be something else going on  Depression screen [facility] 2/9 04/10/2016 01/04/2016 10/04/2015 07/05/2015 06/02/2015  Decreased Interest 0 1 1 0 1  Down, Depressed, Hopeless 0 1 1 0 1  PHQ - 2 Score 0 2 2 0 2  Altered sleeping - 0 0 0 3  Tired, decreased energy - 1 1 0 0  Change in appetite - 0 0 0 0  Feeling bad or failure about yourself  - 1 1 0 2  Trouble concentrating - 1 1 0 1  Moving slowly or fidgety/restless - 0 0 0 0  Suicidal thoughts - 0 0 0 0  PHQ-9 Score - 5 5 0 8  Difficult doing work/chores - - - - Somewhat difficult     Relevant past medical, surgical, family and social history reviewed. Allergies and medications reviewed and updated. History  Smoking Status  . Former Smoker  Smokeless Tobacco  . Never Used   ROS: Per HPI   Objective:    BP 117/70   Pulse 81   Temp 98.3 F (36.8 C) (Oral)   Ht 5\' 5"  (1.651 m)   Wt 233 lb 9.6 oz (106 kg)   BMI 38.87 kg/m   Wt Readings from Last 3 Encounters:  04/10/16 233 lb 9.6 oz (106 kg)  01/04/16 233 lb 3.2 oz (105.8 kg)  10/04/15 237 lb 3.2 oz  (107.6 kg)   Gen: NAD, alert, cooperative with exam, NCAT EYES: EOMI, no conjunctival injection, or no icterus CV: NRRR, normal S1/S2, no murmur, distal pulses 2+ b/l Resp: CTABL, no wheezes, normal WOB Ext: No edema, warm Neuro: Alert and oriented, strength equal b/l UE and LE, coordination grossly normal Psych: tearful at times when talking about her son, no SI, no thoughts of self harm  Assessment & Plan:  Blessing was seen today for follow-up ADHD and depression.  Diagnoses and all orders for this visit:  Depression Well controlled per pt, did become tearful discussing difficulties she has had getting her son the care he needs Feels safe at home Cont fluoxetine, lamictal Cont counseling  ADD (attention deficit disorder) Discussed getting Utox done today, pt said she felt singled out, that she knows other people on similar medicines who don't have to have their urines screened for drugs. Discussed it is my practice to do regular tox screen on anyone on controlled substances and follows our clinic's policy. Pt handed back Rx for adderall, saying she was done with them. Rx written for today were shredded.  I spent 25  minutes with the patient with over 50% of the encounter time dedicated to counseling on the above problems.  Follow up plan: 3 months Rex Krasarol Manly Nestle, MD [facility]

## 2016-05-01 ENCOUNTER — Ambulatory Visit: Payer: Medicaid Other | Admitting: Pediatrics

## 2016-05-08 ENCOUNTER — Encounter: Payer: Self-pay | Admitting: Pediatrics

## 2016-07-12 ENCOUNTER — Ambulatory Visit: Payer: Medicaid Other | Admitting: Pediatrics

## 2016-07-13 ENCOUNTER — Telehealth: Payer: Self-pay | Admitting: Pediatrics

## 2016-07-13 ENCOUNTER — Encounter: Payer: Self-pay | Admitting: Pediatrics

## 2016-09-11 ENCOUNTER — Other Ambulatory Visit: Payer: Self-pay | Admitting: Pediatrics

## 2016-09-11 DIAGNOSIS — Z309 Encounter for contraceptive management, unspecified: Secondary | ICD-10-CM

## 2017-05-08 ENCOUNTER — Other Ambulatory Visit: Payer: Self-pay | Admitting: Nurse Practitioner

## 2017-05-08 DIAGNOSIS — Z309 Encounter for contraceptive management, unspecified: Secondary | ICD-10-CM

## 2017-05-09 NOTE — Telephone Encounter (Signed)
hasnt been seen in over a year, can you let pharmacy know needs to be faxed to [facility]

## 2017-08-04 ENCOUNTER — Other Ambulatory Visit: Payer: Self-pay | Admitting: Family Medicine

## 2017-08-04 DIAGNOSIS — Z309 Encounter for contraceptive management, unspecified: Secondary | ICD-10-CM

## 2018-06-19 ENCOUNTER — Encounter (HOSPITAL_COMMUNITY): Payer: Self-pay | Admitting: Emergency Medicine

## 2018-06-19 ENCOUNTER — Other Ambulatory Visit: Payer: Self-pay

## 2018-06-19 ENCOUNTER — Emergency Department (HOSPITAL_COMMUNITY)
Admission: EM | Admit: 2018-06-19 | Discharge: 2018-06-19 | Disposition: A | Discharge status: Home / Self Care | Payer: Medicaid Other | Attending: Emergency Medicine | Admitting: Emergency Medicine

## 2018-06-19 DIAGNOSIS — Z87891 Personal history of nicotine dependence: Secondary | ICD-10-CM | POA: Insufficient documentation

## 2018-06-19 DIAGNOSIS — H9203 Otalgia, bilateral: Secondary | ICD-10-CM | POA: Diagnosis present

## 2018-06-19 DIAGNOSIS — H6693 Otitis media, unspecified, bilateral: Secondary | ICD-10-CM | POA: Diagnosis not present

## 2018-06-19 DIAGNOSIS — H6690 Otitis media, unspecified, unspecified ear: Secondary | ICD-10-CM

## 2018-06-19 DIAGNOSIS — Z79899 Other long term (current) drug therapy: Secondary | ICD-10-CM | POA: Insufficient documentation

## 2018-06-19 MED ORDER — AMOXICILLIN 500 MG PO CAPS: 500.0000 mg | ORAL_CAPSULE | Freq: Three times a day (TID) | ORAL | 0 refills | Status: DC

## 2018-06-19 NOTE — ED Triage Notes (Signed)
Patient reports bilateral ear pain and congestion x 2 days. No fever.

## 2018-06-19 NOTE — Discharge Instructions (Addendum)
Return if any problems.

## 2018-06-19 NOTE — ED Provider Notes (Signed)
[facility] EMERGENCY DEPARTMENT Provider Note   CSN: [id number] Arrival date & time: 06/19/18  1814     History   Chief Complaint Chief Complaint  Patient presents with  . Otalgia    HPI Tara Randall is a 32 y.o. female.  The history is provided by the patient. No language interpreter was used.  Otalgia  This is a new problem. The current episode started 2 days ago. There is pain in both ears. The problem occurs constantly. The problem has been rapidly worsening. There has been no fever. The pain is at a severity of 4/10. The pain is moderate. Pertinent negatives include no rhinorrhea and no sore throat.  Pt complains of pain in both ears and pain in anterior neck below ears  Past Medical History:  Diagnosis Date  . Anxiety   . Depression     Patient Active Problem List   Diagnosis Date Noted  . BMI 40.0-44.9, adult (HCC) 07/06/2015  . Major depressive disorder, recurrent episode, severe, with psychotic behavior (HCC) 05/22/2015  . Cannabis abuse   . Depression 01/26/2014  . GAD (generalized anxiety disorder) 01/26/2014  . ADD (attention deficit disorder) 01/26/2014    Past Surgical History:  Procedure Laterality Date  . APPENDECTOMY       OB History    Gravida  2   Para  1   Term      Preterm  1   AB  1   Living        SAB      TAB  1   Ectopic      Multiple      Live Births               Home Medications    Prior to Admission medications   Medication Sig Start Date End Date Taking? Authorizing Provider  amoxicillin (AMOXIL) 500 MG capsule Take 1 capsule (500 mg total) by mouth 3 (three) times daily. 06/19/18   Elson AreasSofia, Gustava Berland K, PA-C  amphetamine-dextroamphetamine (ADDERALL XR) 10 MG 24 hr capsule Take 1 capsule (10 mg total) by mouth 2 (two) times daily. 04/10/16   Johna SheriffVincent, Carol L, MD  amphetamine-dextroamphetamine (ADDERALL XR) 10 MG 24 hr capsule Take 1 capsule (10 mg total) by mouth 2 (two) times daily. 04/10/16   Johna SheriffVincent, Carol L,  MD  amphetamine-dextroamphetamine (ADDERALL XR) 10 MG 24 hr capsule Take 1 capsule (10 mg total) by mouth 2 (two) times daily. 04/10/16   Johna SheriffVincent, Carol L, MD  EQ ALLERGY RELIEF 10 MG tablet TAKE ONE TABLET BY MOUTH AT BEDTIME 08/30/15   Johna SheriffVincent, Carol L, MD  famotidine (PEPCID) 20 MG tablet Take 1 tablet (20 mg total) by mouth daily. 10/04/15   Johna SheriffVincent, Carol L, MD  FLUoxetine (PROZAC) 40 MG capsule Take 1 capsule (40 mg total) by mouth daily. 10/04/15   Johna SheriffVincent, Carol L, MD  lamoTRIgine (LAMICTAL) 25 MG tablet Take 1 tablet (25 mg total) by mouth daily. 10/04/15   Johna SheriffVincent, Carol L, MD  traZODone (DESYREL) 50 MG tablet TAKE ONE-HALF TABLET BY MOUTH AT BEDTIME AS NEEDED FOR SLEEP 10/04/15   Johna SheriffVincent, Carol L, MD  TRI-PREVIFEM 0.18/0.215/0.25 MG-35 MCG tablet TAKE 1 TABLET BY MOUTH DAILY. 09/11/16   Bennie PieriniMartin, Mary-Margaret, FNP    Family History Family History  Problem Relation Age of Onset  . Depression Mother        Bi polar,manic  . Heart disease Father   . Depression Father   . ADD / ADHD Brother  Social History Social History   Tobacco Use  . Smoking status: Former Games developer  . Smokeless tobacco: Never Used  Substance Use Topics  . Alcohol use: Yes    Comment: 1 x week  . Drug use: No     Allergies   Latex   Review of Systems Review of Systems  HENT: Positive for ear pain. Negative for rhinorrhea and sore throat.   All other systems reviewed and are negative.    Physical Exam Updated Vital Signs BP (!) 152/91 (BP Location: Right Arm)   Pulse 86   Temp 98.4 F (36.9 C) (Oral)   Resp 16   Ht 5\' 5"  (1.651 m)   Wt 122.5 kg   LMP 05/29/2018   SpO2 100%   BMI 44.93 kg/m   Physical Exam  Constitutional: She appears well-developed and well-nourished.  HENT:  Head: Normocephalic.  Nose: Nose normal.  bilat tms erythema   Eyes: Pupils are equal, round, and reactive to light. Conjunctivae and EOM are normal.  Neck: Normal range of motion.  Cardiovascular: Normal rate.    Pulmonary/Chest: Effort normal.  Abdominal: Soft.  Musculoskeletal: Normal range of motion.  Neurological: She is alert.  Skin: Skin is warm.  Psychiatric: She has a normal mood and affect.  Nursing note and vitals reviewed.    ED Treatments / Results  Labs (all labs ordered are listed, but only abnormal results are displayed) Labs Reviewed - No data to display  EKG None  Radiology No results found.  Procedures Procedures (including critical care time)  Medications Ordered in ED Medications - No data to display   Initial Impression / Assessment and Plan / ED Course  I have reviewed the triage vital signs and the nursing notes.  Pertinent labs & imaging results that were available during my care of the patient were reviewed by me and considered in my medical decision making (see chart for details).       Final Clinical Impressions(s) / ED Diagnoses   Final diagnoses:  Acute otitis media, unspecified otitis media type    ED Discharge Orders         Ordered    amoxicillin (AMOXIL) 500 MG capsule  3 times daily,   Status:  Discontinued     06/19/18 1931    amoxicillin (AMOXIL) 500 MG capsule  3 times daily     06/19/18 1933           Osie Cheeks 06/19/18 2008    Vanetta Mulders, MD 06/20/18 1635

## 2018-07-21 ENCOUNTER — Encounter (HOSPITAL_COMMUNITY): Payer: Self-pay | Admitting: Student

## 2018-07-21 ENCOUNTER — Other Ambulatory Visit: Payer: Self-pay

## 2018-07-21 ENCOUNTER — Inpatient Hospital Stay (HOSPITAL_COMMUNITY)
Admission: EM | Admit: 2018-07-21 | Discharge: 2018-07-24 | DRG: 603 | Disposition: A | Discharge status: Home / Self Care | Payer: Medicaid Other | Attending: Internal Medicine | Admitting: Internal Medicine

## 2018-07-21 DIAGNOSIS — E668 Other obesity: Secondary | ICD-10-CM | POA: Diagnosis present

## 2018-07-21 DIAGNOSIS — Z23 Encounter for immunization: Secondary | ICD-10-CM

## 2018-07-21 DIAGNOSIS — Z6841 Body Mass Index (BMI) 40.0 and over, adult: Secondary | ICD-10-CM

## 2018-07-21 DIAGNOSIS — L03113 Cellulitis of right upper limb: Principal | ICD-10-CM | POA: Diagnosis present

## 2018-07-21 DIAGNOSIS — L02511 Cutaneous abscess of right hand: Secondary | ICD-10-CM | POA: Diagnosis present

## 2018-07-21 DIAGNOSIS — Z91040 Latex allergy status: Secondary | ICD-10-CM

## 2018-07-21 DIAGNOSIS — F411 Generalized anxiety disorder: Secondary | ICD-10-CM | POA: Diagnosis present

## 2018-07-21 DIAGNOSIS — F329 Major depressive disorder, single episode, unspecified: Secondary | ICD-10-CM | POA: Diagnosis present

## 2018-07-21 DIAGNOSIS — L0390 Cellulitis, unspecified: Secondary | ICD-10-CM | POA: Diagnosis present

## 2018-07-21 DIAGNOSIS — Z87891 Personal history of nicotine dependence: Secondary | ICD-10-CM

## 2018-07-21 DIAGNOSIS — F988 Other specified behavioral and emotional disorders with onset usually occurring in childhood and adolescence: Secondary | ICD-10-CM | POA: Diagnosis present

## 2018-07-21 DIAGNOSIS — L03119 Cellulitis of unspecified part of limb: Secondary | ICD-10-CM

## 2018-07-21 DIAGNOSIS — Z818 Family history of other mental and behavioral disorders: Secondary | ICD-10-CM

## 2018-07-21 DIAGNOSIS — Z79899 Other long term (current) drug therapy: Secondary | ICD-10-CM

## 2018-07-21 MED ORDER — LIDOCAINE HCL (PF) 1 % IJ SOLN
5.0000 mL | Freq: Once | INTRAMUSCULAR | Status: AC
  Administered 2018-07-22: 5 mL
  Filled 2018-07-21: qty 6

## 2018-07-21 MED ORDER — TETANUS-DIPHTH-ACELL PERTUSSIS 5-2.5-18.5 LF-MCG/0.5 IM SUSP
0.5000 mL | Freq: Once | INTRAMUSCULAR | Status: AC
  Administered 2018-07-22: 0.5 mL via INTRAMUSCULAR
  Filled 2018-07-21: qty 0.5

## 2018-07-21 MED ORDER — BUPIVACAINE HCL (PF) 0.5 % IJ SOLN
5.0000 mL | Freq: Once | INTRAMUSCULAR | Status: AC
  Administered 2018-07-22: 5 mL
  Filled 2018-07-21: qty 30

## 2018-07-21 NOTE — ED Provider Notes (Signed)
[facility] EMERGENCY DEPARTMENT Provider Note   CSN: [id number] Arrival date & time: 07/21/18  2328     History   Chief Complaint Chief Complaint  Patient presents with  . Laceration    HPI Jena Gaussmily Nordmann is a 32 y.o. female with a history of anxiety and depression who presents to the emergency department with infection to the right third finger that started over past 24 hours.  Patient states that she cannot recall what her specific injury was leading to signs/sxs. She went to bed without complaints and woke up with subjective fever & chills and noted that there was a small pustule area to the medial PIP joint of the 3rd finger this AM. She states that the area has been uncomfortable throughout the day. She relays that at 1600 this afternoon there was not significant redness, image below she provided. She states since 1600 there has been progressive worsening of pain, swelling, & erythema. She states her current discomfort is a 4-5/10, this is substantially worse with movement. No alleviating factors. No meds PTA. Denies nausea, vomiting, numbness, tingling, or weakness. She is unsure what lead to injury- she was wrapping gifts and utilizing scissors yesterday and is unsure if she cut herself. She states she was also tearing down plastic boxes in dirty water yesterday as well. Last tetanus unknown. Denies animal bites/injuries. Denies IVDU. She is R hand dominant.       HPI  Past Medical History:  Diagnosis Date  . Anxiety   . Depression     Patient Active Problem List   Diagnosis Date Noted  . BMI 40.0-44.9, adult (HCC) 07/06/2015  . Major depressive disorder, recurrent episode, severe, with psychotic behavior (HCC) 05/22/2015  . Cannabis abuse   . Depression 01/26/2014  . GAD (generalized anxiety disorder) 01/26/2014  . ADD (attention deficit disorder) 01/26/2014    Past Surgical History:  Procedure Laterality Date  . APPENDECTOMY       OB History    Gravida  2   Para  1   Term      Preterm  1   AB  1   Living        SAB      TAB  1   Ectopic      Multiple      Live Births               Home Medications    Prior to Admission medications   Medication Sig Start Date End Date Taking? Authorizing Provider  amphetamine-dextroamphetamine (ADDERALL XR) 10 MG 24 hr capsule Take 1 capsule (10 mg total) by mouth 2 (two) times daily. 04/10/16  Yes Johna SheriffVincent, Carol L, MD  buPROPion (WELLBUTRIN XL) 150 MG 24 hr tablet Take by mouth. 02/27/18  Yes [provider]  FLUoxetine (PROZAC) 40 MG capsule Take 1 capsule (40 mg total) by mouth daily. 10/04/15  Yes Johna SheriffVincent, Carol L, MD  traZODone (DESYREL) 50 MG tablet TAKE ONE-HALF TABLET BY MOUTH AT BEDTIME AS NEEDED FOR SLEEP 10/04/15  Yes Johna SheriffVincent, Carol L, MD  amoxicillin (AMOXIL) 500 MG capsule Take 1 capsule (500 mg total) by mouth 3 (three) times daily. 06/19/18   Elson AreasSofia, Leslie K, PA-C  amphetamine-dextroamphetamine (ADDERALL XR) 10 MG 24 hr capsule Take 1 capsule (10 mg total) by mouth 2 (two) times daily. 04/10/16   Johna SheriffVincent, Carol L, MD  amphetamine-dextroamphetamine (ADDERALL XR) 10 MG 24 hr capsule Take 1 capsule (10 mg total) by mouth 2 (two) times daily. 04/10/16  Johna Sheriff, MD  buPROPion (WELLBUTRIN) 75 MG tablet Take 75 mg by mouth 2 (two) times daily.    [provider]  EQ ALLERGY RELIEF 10 MG tablet TAKE ONE TABLET BY MOUTH AT BEDTIME 08/30/15   Johna Sheriff, MD  famotidine (PEPCID) 20 MG tablet Take 1 tablet (20 mg total) by mouth daily. 10/04/15   Johna Sheriff, MD  lamoTRIgine (LAMICTAL) 25 MG tablet Take 1 tablet (25 mg total) by mouth daily. 10/04/15   Johna Sheriff, MD  TRI-PREVIFEM 0.18/0.215/0.25 MG-35 MCG tablet TAKE 1 TABLET BY MOUTH DAILY. 09/11/16   Bennie Pierini, FNP    Family History Family History  Problem Relation Age of Onset  . Depression Mother        Bi polar,manic  . Heart disease Father   . Depression Father   . ADD / ADHD Brother      Social History Social History   Tobacco Use  . Smoking status: Former Games developer  . Smokeless tobacco: Never Used  Substance Use Topics  . Alcohol use: Yes    Comment: 1 x week  . Drug use: No     Allergies   Latex   Review of Systems Review of Systems  Constitutional: Positive for chills and fever.  Gastrointestinal: Negative for nausea and vomiting.  Musculoskeletal: Positive for arthralgias and joint swelling.  Skin: Positive for color change and wound.  Neurological: Negative for weakness and numbness.  All other systems reviewed and are negative.    Physical Exam Updated Vital Signs BP 126/89   Pulse (!) 103   Temp 99.5 F (37.5 C) (Oral)   Resp 16   Ht 5\' 6"  (1.676 m)   Wt 124.7 kg   SpO2 100%   BMI 44.39 kg/m   Physical Exam Vitals signs and nursing note reviewed.  Constitutional:      General: She is not in acute distress.    Appearance: She is well-developed.  HENT:     Head: Normocephalic and atraumatic.  Eyes:     General:        Right eye: No discharge.        Left eye: No discharge.     Conjunctiva/sclera: Conjunctivae normal.  Cardiovascular:     Rate and Rhythm: Regular rhythm. Tachycardia present.     Pulses:          Radial pulses are 2+ on the right side and 2+ on the left side.  Pulmonary:     Effort: Pulmonary effort is normal. No respiratory distress.  Abdominal:     General: There is no distension.     Palpations: Abdomen is soft.     Tenderness: There is no abdominal tenderness.  Musculoskeletal:     Comments: Upper extremities: Patient has 3mm area of purulence to the medial aspect of the 3rd PIP joint. There is significant soft tissue swelling to the PIP & proximal phalanx of the 3rd digit. There is dorsal erythema that starts at the middle phalanx and extends to mid forearm with streaking appearance. Patient has full AROM to the elbows & wrists as well as all digits with the exception of the 3rd MCP & IP joints. She is able  to flex with some pain, but has trouble with extension- can do this somewhat. PROM with MCP/IP joint extension is painful. She is tender to palpation most prominently to the R PIP joint which extends to the proximal phalanx and MCP area dorsally, nontender to palmar surface.  Skin:    Capillary Refill: Capillary refill takes less than 2 seconds.  Neurological:     Mental Status: She is alert.     Comments: Clear speech. Sensation grossly intact to bilateral upper extremities. Difficult to assess grip strength secondary to pain.   Psychiatric:        Behavior: Behavior normal.        Thought Content: Thought content normal.                ED Treatments / Results  Labs (all labs ordered are listed, but only abnormal results are displayed) Labs Reviewed  CBC WITH DIFFERENTIAL/PLATELET - Abnormal; Notable for the following components:      Result Value   WBC 11.9 (*)    MCV 101.0 (*)    Neutro Abs 9.2 (*)    All other components within normal limits  BASIC METABOLIC PANEL - Abnormal; Notable for the following components:   Sodium 133 (*)    Potassium 3.1 (*)    Glucose, Bld 105 (*)    Creatinine, Ser 1.12 (*)    All other components within normal limits  AEROBIC CULTURE (SUPERFICIAL SPECIMEN)  I-STAT BETA HCG BLOOD, ED ([facility], [facility], [facility] ONLY)   EKG None  Radiology No results found.  Procedures .Marland Kitchen.Incision and Drainage Date/Time: 07/22/2018 12:39 AM Performed by: Cherly AndersonPetrucelli, Aras Albarran R, PA-C Authorized by: Cherly AndersonPetrucelli, Armistead Sult R, PA-C   Consent:    Consent obtained:  Verbal   Consent given by:  Patient   Risks discussed:  Bleeding, damage to other organs, incomplete drainage, infection and pain   Alternatives discussed:  No treatment Location:    Type:  Abscess   Size:  2mm   Location:  Upper extremity   Upper extremity location:  Finger   Finger location:  R long finger Pre-procedure details:    Skin preparation:  Betadine Anesthesia (see MAR for exact  dosages):    Anesthesia method:  Local infiltration   Local anesthetic:  Lidocaine 2% w/o epi and bupivacaine 0.5% w/o epi Procedure type:    Complexity:  Simple Procedure details:    Incision types:  Stab incision   Scalpel blade:  11   Drainage:  Bloody   Drainage amount:  Scant   Wound treatment:  Wound left open   Packing materials:  None Post-procedure details:    Patient tolerance of procedure:  Tolerated well, no immediate complications   (including critical care time)  Medications Ordered in ED Medications  Tdap (BOOSTRIX) injection 0.5 mL (has no administration in time range)  lidocaine (PF) (XYLOCAINE) 1 % injection 5 mL (has no administration in time range)  bupivacaine (MARCAINE) 0.5 % injection 5 mL (has no administration in time range)     Initial Impression / Assessment and Plan / ED Course  I have reviewed the triage vital signs and the nursing notes.  Pertinent labs & imaging results that were available during my care of the patient were reviewed by me and considered in my medical decision making (see chart for details).  Patient presents to the emergency department with rapidly progressing right upper extremity infection which seems to have originated at the right third PIP joint. Unclear cause. Seems to be small purulent area to the medial 3rd PIP. Pictures provided from 1600 this afternoon without significant erythema, now with streaking erythema up to mid forearm. NVI distally. Does not seem consistent with flexor tenosynovitis given lack of palmar surface erythema/pain and able to flex at MCP/IP joints. Basic  labs & xray ordered Bedside I&D attempted per discussion with supervising physician Dr. Bebe Shaggy. Scant drainage, attempted culture, however no significant purulent drainage for this.   Tetanus updated. Vancomycin ordered.    Leukocytosis @ 11.9 w/ left shift.   00:45: CONSULT: Discussed case with orthopedic surgeon Dr. Romeo Apple- plan to admit to  hospitalist service here, he agrees to consult in the AM.   X-ray pending. Patient signed out to supervising physician Dr. Bebe Shaggy, who has personally evaluated patient, pending xray and call for admission.   Final Clinical Impressions(s) / ED Diagnoses   Final diagnoses:  None    ED Discharge Orders    None       Cherly Anderson, PA-C 07/22/18 0112    Zadie Rhine, MD 07/22/18 [phone]

## 2018-07-21 NOTE — ED Triage Notes (Signed)
Pt states that she had a small cut on her right middle finger last night, unsure of exactly when she cut her finger, pt reports that she woke up with chills, unsure of fever, redness that is extending up right arm area with swelling.

## 2018-07-22 ENCOUNTER — Emergency Department (HOSPITAL_COMMUNITY): Payer: Medicaid Other

## 2018-07-22 DIAGNOSIS — F411 Generalized anxiety disorder: Secondary | ICD-10-CM

## 2018-07-22 DIAGNOSIS — L03019 Cellulitis of unspecified finger: Secondary | ICD-10-CM

## 2018-07-22 DIAGNOSIS — L03119 Cellulitis of unspecified part of limb: Secondary | ICD-10-CM

## 2018-07-22 DIAGNOSIS — L0390 Cellulitis, unspecified: Secondary | ICD-10-CM | POA: Diagnosis present

## 2018-07-22 LAB — BASIC METABOLIC PANEL
Anion gap: 11 (ref 5–15)
BUN: 12 mg/dL (ref 6–20)
CALCIUM: 9 mg/dL (ref 8.9–10.3)
CO2: 23 mmol/L (ref 22–32)
Chloride: 99 mmol/L (ref 98–111)
Creatinine, Ser: 1.12 mg/dL — ABNORMAL HIGH (ref 0.44–1.00)
GFR calc Af Amer: >60 mL/min (ref >60)
Glucose, Bld: 105 mg/dL — ABNORMAL HIGH (ref 70–99)
Potassium: 3.1 mmol/L — ABNORMAL LOW (ref 3.5–5.1)
Sodium: 133 mmol/L — ABNORMAL LOW (ref 135–145)

## 2018-07-22 LAB — CBC WITH DIFFERENTIAL/PLATELET
Abs Immature Granulocytes: 0.05 10*3/uL (ref 0.00–0.07)
Basophils Absolute: 0.1 10*3/uL (ref 0.0–0.1)
Basophils Relative: 0 %
EOS PCT: 1 %
Eosinophils Absolute: 0.1 10*3/uL (ref 0.0–0.5)
HCT: 39.3 % (ref 36.0–46.0)
Hemoglobin: 12.5 g/dL (ref 12.0–15.0)
Immature Granulocytes: 0 %
Lymphocytes Relative: 17 %
Lymphs Abs: 2 10*3/uL (ref 0.7–4.0)
MCH: 32.1 pg (ref 26.0–34.0)
MCHC: 31.8 g/dL (ref 30.0–36.0)
MCV: 101 fL — ABNORMAL HIGH (ref 80.0–100.0)
Monocytes Absolute: 0.5 10*3/uL (ref 0.1–1.0)
Monocytes Relative: 4 %
Neutro Abs: 9.2 10*3/uL — ABNORMAL HIGH (ref 1.7–7.7)
Neutrophils Relative %: 78 %
Platelets: 287 10*3/uL (ref 150–400)
RBC: 3.89 MIL/uL (ref 3.87–5.11)
RDW: 13.2 % (ref 11.5–15.5)
WBC: 11.9 10*3/uL — ABNORMAL HIGH (ref 4.0–10.5)
nRBC: 0 % (ref 0.0–0.2)

## 2018-07-22 LAB — SEDIMENTATION RATE: Sed Rate: 41 mm/hr — ABNORMAL HIGH (ref 0–22)

## 2018-07-22 LAB — I-STAT BETA HCG BLOOD, ED (MC, WL, AP ONLY): I-stat hCG, quantitative: <5 m[IU]/mL (ref <5)

## 2018-07-22 LAB — C-REACTIVE PROTEIN: CRP: 8 mg/dL — AB (ref <1.0)

## 2018-07-22 MED ORDER — ACETAMINOPHEN 650 MG RE SUPP: 650.0000 mg | Freq: Four times a day (QID) | RECTAL | Status: DC | PRN

## 2018-07-22 MED ORDER — ONDANSETRON HCL 4 MG/5ML PO SOLN
4.0000 mg | Freq: Once | ORAL | Status: DC
  Filled 2018-07-22: qty 1

## 2018-07-22 MED ORDER — ONDANSETRON HCL 4 MG/2ML IJ SOLN
4.0000 mg | Freq: Once | INTRAMUSCULAR | Status: AC
  Administered 2018-07-22: 4 mg via INTRAVENOUS

## 2018-07-22 MED ORDER — SODIUM CHLORIDE 0.9 % IV BOLUS
500.0000 mL | Freq: Once | INTRAVENOUS | Status: AC
  Administered 2018-07-22: 500 mL via INTRAVENOUS

## 2018-07-22 MED ORDER — MORPHINE SULFATE (PF) 4 MG/ML IV SOLN
4.0000 mg | Freq: Once | INTRAVENOUS | Status: AC
  Administered 2018-07-22: 4 mg via INTRAVENOUS
  Filled 2018-07-22: qty 1

## 2018-07-22 MED ORDER — VANCOMYCIN HCL IN DEXTROSE 1-5 GM/200ML-% IV SOLN
1000.0000 mg | Freq: Two times a day (BID) | INTRAVENOUS | Status: DC
  Administered 2018-07-22 – 2018-07-24 (×4): 1000 mg via INTRAVENOUS
  Filled 2018-07-22 (×4): qty 200

## 2018-07-22 MED ORDER — SODIUM CHLORIDE 0.9% FLUSH
3.0000 mL | Freq: Two times a day (BID) | INTRAVENOUS | Status: DC
  Administered 2018-07-22 – 2018-07-24 (×4): 3 mL via INTRAVENOUS

## 2018-07-22 MED ORDER — SODIUM CHLORIDE 0.9% FLUSH: 3.0000 mL | INTRAVENOUS | Status: DC | PRN

## 2018-07-22 MED ORDER — ACETAMINOPHEN 325 MG PO TABS: 650.0000 mg | ORAL_TABLET | Freq: Four times a day (QID) | ORAL | Status: DC | PRN

## 2018-07-22 MED ORDER — LIDOCAINE HCL (PF) 2 % IJ SOLN
INTRAMUSCULAR | Status: AC
  Administered 2018-07-22: 01:00:00
  Filled 2018-07-22: qty 10

## 2018-07-22 MED ORDER — SODIUM CHLORIDE 0.9 % IV SOLN: 250.0000 mL | INTRAVENOUS | Status: DC | PRN

## 2018-07-22 MED ORDER — SODIUM CHLORIDE 0.9 % IV SOLN: INTRAVENOUS | Status: DC | PRN

## 2018-07-22 MED ORDER — KETOROLAC TROMETHAMINE 30 MG/ML IJ SOLN: 30.0000 mg | Freq: Four times a day (QID) | INTRAMUSCULAR | Status: DC | PRN

## 2018-07-22 MED ORDER — VANCOMYCIN HCL IN DEXTROSE 1-5 GM/200ML-% IV SOLN
1000.0000 mg | INTRAVENOUS | Status: AC
  Administered 2018-07-22 (×2): 1000 mg via INTRAVENOUS
  Filled 2018-07-22 (×2): qty 200

## 2018-07-22 MED ORDER — ONDANSETRON HCL 4 MG/2ML IJ SOLN
INTRAMUSCULAR | Status: AC
  Filled 2018-07-22: qty 2

## 2018-07-22 NOTE — Progress Notes (Addendum)
Attempted to call report at 0137, primary nurse busy at the time. Report received at 0150 from [place]. Pt arrived to room 324 with no s/s of distress. Oriented to room, safety video completed, oriented pt with call bell. VSS. Vancomycin and NS Bolus infusing with no complications. Right upper extremity elevated on two pillows. Will continue to monitor.

## 2018-07-22 NOTE — Progress Notes (Signed)
Pharmacy Antibiotic Note  Tara Randall is a 32 y.o. female admitted on 07/21/2018 with cellulitis.  Pharmacy has been consulted for Vancomycin dosing.  Plan: Vancomycin 2000mg  loading dose, then 1000mg   IV every 12 hours.  Goal trough 10-15 mcg/mL.  F/U cxs and clinical progress Monitor V/S, labs and levels as indicated  Height: 5\' 6"  (167.6 cm) Weight: 275 lb (124.7 kg) IBW/kg (Calculated) : 59.3  Temp (24hrs), Avg:98.9 F (37.2 C), Min:98.4 F (36.9 C), Max:99.5 F (37.5 C)  Recent Labs  Lab 07/21/18 2359  WBC 11.9*  CREATININE 1.12*    Estimated Creatinine Clearance: 97.3 mL/min (A) (by C-G formula based on SCr of 1.12 mg/dL (H)).   Normalized CrCl is 7081mls/min  Allergies  Allergen Reactions  . Latex Rash    Antimicrobials this admission: Vancomycin 12/23 >>   Dose adjustments this admission: N/A  Microbiology results:  12/23 Wound Cx: pending  Thank you for allowing pharmacy to be a part of this patient's care.  Tara Randall, BS Pharm D, [place] Clinical Pharmacist Pager [phone] 07/22/2018 7:31 AM

## 2018-07-22 NOTE — ED Notes (Signed)
Report given to Janelle RN on 300 

## 2018-07-22 NOTE — Progress Notes (Signed)
07/21/2018 11:33 PM  07/22/2018 4:49 PM  Tara Randall was seen and examined.  The H&P by the admitting provider, orders, imaging was reviewed.  Please see new orders.  Will continue to follow.  Orthopedics consulted.  Please see orders and consult notes.   Vitals:   07/22/18 0619 07/22/18 1420  BP: 134/61 123/68  Pulse: 84 79  Resp: 16 18  Temp: 98.7 F (37.1 C) (!) 97.5 F (36.4 C)  SpO2: 100% 100%   Results for orders placed or performed during the hospital encounter of 07/21/18  Wound or Superficial Culture  Result Value Ref Range   Specimen Description      ABSCESS HAND Performed at [facility], [address] [place], [place] [address]    Special Requests      NONE Performed at [facility], [address] [place], [place] [address]    Gram Stain PENDING    Culture PENDING    Report Status PENDING   CBC with Differential  Result Value Ref Range   WBC 11.9 (H) 4.0 - 10.5 K/uL   RBC 3.89 3.87 - 5.11 MIL/uL   Hemoglobin 12.5 12.0 - 15.0 g/dL   HCT 62.939.3 52.836.0 - 41.346.0 %   MCV 101.0 (H) 80.0 - 100.0 fL   MCH 32.1 26.0 - 34.0 pg   MCHC 31.8 30.0 - 36.0 g/dL   RDW 24.413.2 01.011.5 - 27.215.5 %   Platelets 287 150 - 400 K/uL   nRBC 0.0 0.0 - 0.2 %   Neutrophils Relative % 78 %   Neutro Abs 9.2 (H) 1.7 - 7.7 K/uL   Lymphocytes Relative 17 %   Lymphs Abs 2.0 0.7 - 4.0 K/uL   Monocytes Relative 4 %   Monocytes Absolute 0.5 0.1 - 1.0 K/uL   Eosinophils Relative 1 %   Eosinophils Absolute 0.1 0.0 - 0.5 K/uL   Basophils Relative 0 %   Basophils Absolute 0.1 0.0 - 0.1 K/uL   Immature Granulocytes 0 %   Abs Immature Granulocytes 0.05 0.00 - 0.07 K/uL  Basic metabolic panel  Result Value Ref Range   Sodium 133 (L) 135 - 145 mmol/L   Potassium 3.1 (L) 3.5 - 5.1 mmol/L   Chloride 99 98 - 111 mmol/L   CO2 23 22 - 32 mmol/L   Glucose, Bld 105 (H) 70 - 99 mg/dL   BUN 12 6 - 20 mg/dL   Creatinine, Ser 5.361.12 (H) 0.44 - 1.00 mg/dL   Calcium 9.0 8.9 - 64.410.3 mg/dL   GFR calc non Af  Amer >60 >60 mL/min   GFR calc Af Amer >60 >60 mL/min   Anion gap 11 5 - 15  Sedimentation rate  Result Value Ref Range   Sed Rate 41 (H) 0 - 22 mm/hr  C-reactive protein  Result Value Ref Range   CRP 8.0 (H) <1.0 mg/dL  I-Stat beta hCG blood, ED  Result Value Ref Range   I-stat hCG, quantitative <5.0 <5 mIU/mL   Comment 3           C. Laural BenesJohnson, MD [facility]

## 2018-07-22 NOTE — Progress Notes (Signed)
Patient ID: Tara Randall, female   DOB: July 30, 1986, 32 y.o.   MRN: [id number]   BP 134/61   Pulse 84   Temp 98.7 F (37.1 C) (Oral)   Resp 16   Ht 5\' 6"  (1.676 m)   Wt 124.7 kg   SpO2 100%   BMI 44.39 kg/m   CBC Latest Ref Rng & Units 07/21/2018 05/21/2015  WBC 4.0 - 10.5 K/uL 11.9(H) 8.3  Hemoglobin 12.0 - 15.0 g/dL 40.912.5 81.113.4  Hematocrit 91.436.0 - 46.0 % 39.3 40.2  Platelets 150 - 400 K/uL 287 249   BMP Latest Ref Rng & Units 07/21/2018 05/21/2015 11/21/2013  Glucose 70 - 99 mg/dL 782(N105(H) 85 89  BUN 6 - 20 mg/dL 12 10 14   Creatinine 0.44 - 1.00 mg/dL 5.62(Z1.12(H) 3.080.95 6.570.90  BUN/Creat Ratio 8 - 20 - - 16  Sodium 135 - 145 mmol/L 133(L) 138 139  Potassium 3.5 - 5.1 mmol/L 3.1(L) 4.1 4.6  Chloride 98 - 111 mmol/L 99 103 100  CO2 22 - 32 mmol/L 23 24 26   Calcium 8.9 - 10.3 mg/dL 9.0 9.8 9.5   Hand cellulitis lymphangitis Currently did not expect surgical intervention is needed continue IV antibiotics may take 5 to 7 days will follow

## 2018-07-22 NOTE — H&P (Signed)
History and Physical    Tara Randall ZOX:[id number] DOB: 1985/10/04 DOA: 07/21/2018  PCP: Lindley Magnus, PA-C  Patient coming from: Home  Chief Complaint: Right hand middle finger red and swollen  HPI: Tara Randall is a 32 y.o. female with medical history significant of anxiety depression comes in with 1 day of increasing right middle finger swelling and redness streaking up her arm.  Patient reports over the last day she is been wrapping a lot of presence and think she nicked her self on some paper box.  The redness is progressively gotten much worse over the last 24 hours her middle finger is red and swollen which streaking up to the past forearm area.  No recent antibiotic usage.  No nausea vomiting diarrhea.  Patient be referred for admission for cellulitis of her right hand.  Dr. Romeo Apple has been consulted and will see patient in the morning.  Review of Systems: As per HPI otherwise 10 point review of systems negative.   Past Medical History:  Diagnosis Date  . Anxiety   . Depression     Past Surgical History:  Procedure Laterality Date  . APPENDECTOMY       reports that she has quit smoking. She has never used smokeless tobacco. She reports current alcohol use. She reports that she does not use drugs.  Allergies  Allergen Reactions  . Latex Rash    Family History  Problem Relation Age of Onset  . Depression Mother        Bi polar,manic  . Heart disease Father   . Depression Father   . ADD / ADHD Brother     Prior to Admission medications   Medication Sig Start Date End Date Taking? Authorizing Provider  amphetamine-dextroamphetamine (ADDERALL XR) 10 MG 24 hr capsule Take 1 capsule (10 mg total) by mouth 2 (two) times daily. 04/10/16  Yes Johna Sheriff, MD  buPROPion (WELLBUTRIN XL) 150 MG 24 hr tablet Take by mouth. 02/27/18  Yes [provider]  FLUoxetine (PROZAC) 40 MG capsule Take 1 capsule (40 mg total) by mouth daily. 10/04/15  Yes Johna Sheriff, MD   traZODone (DESYREL) 50 MG tablet TAKE ONE-HALF TABLET BY MOUTH AT BEDTIME AS NEEDED FOR SLEEP 10/04/15  Yes Johna Sheriff, MD  amoxicillin (AMOXIL) 500 MG capsule Take 1 capsule (500 mg total) by mouth 3 (three) times daily. 06/19/18   Elson Areas, PA-C  amphetamine-dextroamphetamine (ADDERALL XR) 10 MG 24 hr capsule Take 1 capsule (10 mg total) by mouth 2 (two) times daily. 04/10/16   Johna Sheriff, MD  amphetamine-dextroamphetamine (ADDERALL XR) 10 MG 24 hr capsule Take 1 capsule (10 mg total) by mouth 2 (two) times daily. 04/10/16   Johna Sheriff, MD  buPROPion (WELLBUTRIN) 75 MG tablet Take 75 mg by mouth 2 (two) times daily.    [provider]  EQ ALLERGY RELIEF 10 MG tablet TAKE ONE TABLET BY MOUTH AT BEDTIME 08/30/15   Johna Sheriff, MD  famotidine (PEPCID) 20 MG tablet Take 1 tablet (20 mg total) by mouth daily. 10/04/15   Johna Sheriff, MD  lamoTRIgine (LAMICTAL) 25 MG tablet Take 1 tablet (25 mg total) by mouth daily. 10/04/15   Johna Sheriff, MD  TRI-PREVIFEM 0.18/0.215/0.25 MG-35 MCG tablet TAKE 1 TABLET BY MOUTH DAILY. 09/11/16   Bennie Pierini, FNP    Physical Exam: Vitals:   07/21/18 2342 07/21/18 2345 07/22/18 0217  BP: 126/89  134/69  Pulse: (!) 103  86  Resp: 16  18  Temp: 99.5 F (37.5 C)  98.4 F (36.9 C)  TempSrc: Oral  Oral  SpO2: 100%  100%  Weight:  124.7 kg   Height:  5\' 6"  (1.676 m)       Constitutional: NAD, calm, comfortable Vitals:   07/21/18 2342 07/21/18 2345 07/22/18 0217  BP: 126/89  134/69  Pulse: (!) 103  86  Resp: 16  18  Temp: 99.5 F (37.5 C)  98.4 F (36.9 C)  TempSrc: Oral  Oral  SpO2: 100%  100%  Weight:  124.7 kg   Height:  5\' 6"  (1.676 m)    Eyes: PERRL, lids and conjunctivae normal ENMT: Mucous membranes are moist. Posterior pharynx clear of any exudate or lesions.Normal dentition.  Neck: normal, supple, no masses, no thyromegaly Respiratory: clear to auscultation bilaterally, no wheezing, no  crackles. Normal respiratory effort. No accessory muscle use.  Cardiovascular: Regular rate and rhythm, no murmurs / rubs / gallops. No extremity edema. 2+ pedal pulses. No carotid bruits.  Abdomen: no tenderness, no masses palpated. No hepatosplenomegaly. Bowel sounds positive.  Musculoskeletal: no clubbing / cyanosis. No joint deformity upper and lower extremities. Good ROM, no contractures. Normal muscle tone.  Skin: no rashes, lesions, ulcers. No induration except right middle finger red swollen with streaking up dorsum portion of the hand up to mid forearm area no fluctuance or induration in the area Neurologic: CN 2-12 grossly intact. Sensation intact, DTR normal. Strength 5/5 in all 4.  Psychiatric: Normal judgment and insight. Alert and oriented x 3. Normal mood.    Labs on Admission: I have personally reviewed following labs and imaging studies  CBC: Recent Labs  Lab 07/21/18 2359  WBC 11.9*  NEUTROABS 9.2*  HGB 12.5  HCT 39.3  MCV 101.0*  PLT 287   Basic Metabolic Panel: Recent Labs  Lab 07/21/18 2359  NA 133*  K 3.1*  CL 99  CO2 23  GLUCOSE 105*  BUN 12  CREATININE 1.12*  CALCIUM 9.0   GFR: Estimated Creatinine Clearance: 97.3 mL/min (A) (by C-G formula based on SCr of 1.12 mg/dL (H)). Liver Function Tests: No results for input(s): AST, ALT, ALKPHOS, BILITOT, PROT, ALBUMIN in the last 168 hours. No results for input(s): LIPASE, AMYLASE in the last 168 hours. No results for input(s): AMMONIA in the last 168 hours. Coagulation Profile: No results for input(s): INR, PROTIME in the last 168 hours. Cardiac Enzymes: No results for input(s): CKTOTAL, CKMB, CKMBINDEX, TROPONINI in the last 168 hours. BNP (last 3 results) No results for input(s): PROBNP in the last 8760 hours. HbA1C: No results for input(s): HGBA1C in the last 72 hours. CBG: No results for input(s): GLUCAP in the last 168 hours. Lipid Profile: No results for input(s): CHOL, HDL, LDLCALC, TRIG,  CHOLHDL, LDLDIRECT in the last 72 hours. Thyroid Function Tests: No results for input(s): TSH, T4TOTAL, FREET4, T3FREE, THYROIDAB in the last 72 hours. Anemia Panel: No results for input(s): VITAMINB12, FOLATE, FERRITIN, TIBC, IRON, RETICCTPCT in the last 72 hours. Urine analysis: No results found for: COLORURINE, APPEARANCEUR, LABSPEC, PHURINE, GLUCOSEU, HGBUR, BILIRUBINUR, KETONESUR, PROTEINUR, UROBILINOGEN, NITRITE, LEUKOCYTESUR Sepsis Labs: !!!!!!!!!!!!!!!!!!!!!!!!!!!!!!!!!!!!!!!!!!!! @LABRCNTIP (procalcitonin:4,lacticidven:4) )No results found for this or any previous visit (from the past 240 hour(s)).   Radiological Exams on Admission: Dg Hand Complete Right  Result Date: 07/22/2018 CLINICAL DATA:  Laceration to the right third finger, with erythema and swelling. EXAM: RIGHT HAND - COMPLETE 3+ VIEW COMPARISON:  None. FINDINGS: There is no evidence of fracture or  dislocation. The joint spaces are preserved. The carpal rows are intact, and demonstrate normal alignment. The known soft tissue laceration is not well characterized on radiograph. Soft tissue swelling is noted about the third digit. No radiopaque foreign bodies are seen. IMPRESSION: No evidence of fracture or dislocation. No radiopaque foreign bodies seen. Electronically Signed   By: Roanna RaiderJeffery  Chang M.D.   On: 07/22/2018 01:04    Old chart reviewed Case discussed with ED Dr. Estell HarpinZammit in the ED   Assessment/Plan 32 year old female with hand cellulitis right Principal Problem:   Cellulitis-placed on IV vancomycin.  Elevate hand.  Patient received Tdap in the emergency department.  Monitor renal function while on vancomycin.  Orthopedic surgery consulted.  Active Problems:   GAD (generalized anxiety disorder)-clarify resume home meds   ADD (attention deficit disorder)-clarify resume home meds      DVT prophylaxis: SCDs Code Status: Full Family Communication: None Disposition Plan: 1 to 3 days Consults called: Orthopedic  surgery Admission status: Admission   Davontae Prusinski A MD [facility]  If 7PM-7AM, please contact night-coverage [email or web address] Password [email or web address]  07/22/2018, 2:50 AM

## 2018-07-22 NOTE — Consult Note (Addendum)
Reason for Consult: Right long finger infection Referring Physician: Consult requested by emergency room physician Dr Bebe ShaggyWickline  .Tara Randall is an 32 y.o. female.  HPI: 32 year old female no diabetic history history of depression ADD comes in with complaints of laceration radial side right long finger just distal to the PIP joint which progressed to swelling loss of motion and streaking of the right upper extremity and increasing pain over the last few hours prior to admission.  She reports no trauma does not remember how she obtained a laceration although she said she was wrapping gifts and she was cleaning up some garbage in her backyard   Past Medical History:  Diagnosis Date  . Anxiety   . Depression     Past Surgical History:  Procedure Laterality Date  . APPENDECTOMY      Family History  Problem Relation Age of Onset  . Depression Mother        Bi polar,manic  . Heart disease Father   . Depression Father   . ADD / ADHD Brother     Social History:  reports that she has quit smoking. She has never used smokeless tobacco. She reports current alcohol use. She reports that she does not use drugs.  Allergies:  Allergies  Allergen Reactions  . Latex Rash    Medications:  Meds ordered this encounter  Medications  . Tdap (BOOSTRIX) injection 0.5 mL  . lidocaine (PF) (XYLOCAINE) 1 % injection 5 mL  . bupivacaine (MARCAINE) 0.5 % injection 5 mL  . lidocaine (XYLOCAINE) 2 % injection    Marzetta MerinoEllis, Kaitlyn   : cabinet override  . vancomycin (VANCOCIN) IVPB 1000 mg/200 mL premix    Order Specific Question:   Indication:    Answer:   Cellulitis  . DISCONTD: 0.9 %  sodium chloride infusion    Carrier Fluid Protocol  . sodium chloride 0.9 % bolus 500 mL  . morphine 4 MG/ML injection 4 mg  . DISCONTD: ondansetron (ZOFRAN) 4 MG/5ML solution 4 mg  . ondansetron (ZOFRAN) 4 MG/2ML injection    Talbott, Tina   : cabinet override  . ondansetron (ZOFRAN) injection 4 mg  . sodium  chloride flush (NS) 0.9 % injection 3 mL  . sodium chloride flush (NS) 0.9 % injection 3 mL  . 0.9 %  sodium chloride infusion  . OR Linked Order Group   . acetaminophen (TYLENOL) tablet 650 mg   . acetaminophen (TYLENOL) suppository 650 mg  . ketorolac (TORADOL) 30 MG/ML injection 30 mg     Results for orders placed or performed during the hospital encounter of 07/21/18 (from the past 48 hour(s))  CBC with Differential     Status: Abnormal   Collection Time: 07/21/18 11:59 PM  Result Value Ref Range   WBC 11.9 (H) 4.0 - 10.5 K/uL   RBC 3.89 3.87 - 5.11 MIL/uL   Hemoglobin 12.5 12.0 - 15.0 g/dL   HCT 78.239.3 95.636.0 - 21.346.0 %   MCV 101.0 (H) 80.0 - 100.0 fL   MCH 32.1 26.0 - 34.0 pg   MCHC 31.8 30.0 - 36.0 g/dL   RDW 08.613.2 57.811.5 - 46.915.5 %   Platelets 287 150 - 400 K/uL   nRBC 0.0 0.0 - 0.2 %   Neutrophils Relative % 78 %   Neutro Abs 9.2 (H) 1.7 - 7.7 K/uL   Lymphocytes Relative 17 %   Lymphs Abs 2.0 0.7 - 4.0 K/uL   Monocytes Relative 4 %   Monocytes Absolute 0.5 0.1 -  1.0 K/uL   Eosinophils Relative 1 %   Eosinophils Absolute 0.1 0.0 - 0.5 K/uL   Basophils Relative 0 %   Basophils Absolute 0.1 0.0 - 0.1 K/uL   Immature Granulocytes 0 %   Abs Immature Granulocytes 0.05 0.00 - 0.07 K/uL    Comment: Performed at [facility], [address] [place], [place] [address]  Basic metabolic panel     Status: Abnormal   Collection Time: 07/21/18 11:59 PM  Result Value Ref Range   Sodium 133 (L) 135 - 145 mmol/L   Potassium 3.1 (L) 3.5 - 5.1 mmol/L   Chloride 99 98 - 111 mmol/L   CO2 23 22 - 32 mmol/L   Glucose, Bld 105 (H) 70 - 99 mg/dL   BUN 12 6 - 20 mg/dL   Creatinine, Ser 7.821.12 (H) 0.44 - 1.00 mg/dL   Calcium 9.0 8.9 - 95.610.3 mg/dL   GFR calc non Af Amer >60 >60 mL/min   GFR calc Af Amer >60 >60 mL/min   Anion gap 11 5 - 15    Comment: Performed at [facility], [address] [place], [place] [address]  I-Stat beta hCG blood, ED     Status: None   Collection Time: 07/22/18 12:25  AM  Result Value Ref Range   I-stat hCG, quantitative <5.0 <5 mIU/mL   Comment 3            Comment:   GEST. AGE      CONC.  (mIU/mL)   <=1 WEEK        5 - 50     2 WEEKS       50 - 500     3 WEEKS       100 - 10,000     4 WEEKS     1,000 - 30,000        FEMALE AND NON-PREGNANT FEMALE:     LESS THAN 5 mIU/mL     Dg Hand Complete Right  Result Date: 07/22/2018 CLINICAL DATA:  Laceration to the right third finger, with erythema and swelling. EXAM: RIGHT HAND - COMPLETE 3+ VIEW COMPARISON:  None. FINDINGS: There is no evidence of fracture or dislocation. The joint spaces are preserved. The carpal rows are intact, and demonstrate normal alignment. The known soft tissue laceration is not well characterized on radiograph. Soft tissue swelling is noted about the third digit. No radiopaque foreign bodies are seen. IMPRESSION: No evidence of fracture or dislocation. No radiopaque foreign bodies seen. Electronically Signed   By: Roanna RaiderJeffery  Chang M.D.   On: 07/22/2018 01:04    Review of Systems  Constitutional: Negative for chills, fever and malaise/fatigue.  Musculoskeletal: Positive for joint pain.  Neurological: Negative for tingling, sensory change and focal weakness.  Psychiatric/Behavioral: Positive for hallucinations. Negative for memory loss.  All other systems reviewed and are negative.  Blood pressure 134/61, pulse 84, temperature 98.7 F (37.1 C), temperature source Oral, resp. rate 16, height 5\' 6"  (1.676 m), weight 124.7 kg, SpO2 100 %. Physical Exam General appearance the patient has a moderate amount of obesity otherwise normal development grooming and hygiene mood and affect are normal gait and station are unremarkable with no assistive devices required the right upper extremity shows a laceration just distal to the PIP joint of the radial side of the right long finger the finger is held in slight flexion there is tenderness around the finger with swelling of the finger starting mid  middle phalanx to M TP  joint level with no instability no strength or flexor tendon function loss skin is erythematous capillary refill color is normal swelling is noted lymphangitis without lymph node involvement sensation remains intact no pathologic reflexes are elicited coordination is normal   Assessment/Plan: Hand x-ray shows no air gas fracture dislocation or signs of osteomyelitis  Cellulitis with some lymphatic inflammatory infectious streaking right long finger with laceration.  Start IV antibiotics monitor for progression monitor white count sed rate C-reactive protein.  It does not look like it will need surgery at this time but will follow progression  Fuller Canada 07/22/2018, 7:29 AM

## 2018-07-22 NOTE — Progress Notes (Signed)
ANTIBIOTIC CONSULT NOTE-Preliminary  Pharmacy Consult for Vancomycin Indication: Cellulitis of right 3rd finger  Allergies  Allergen Reactions  . Latex Rash    Patient Measurements: Height: 5\' 6"  (167.6 cm) Weight: 275 lb (124.7 kg) IBW/kg (Calculated) : 59.3 kg   Vital Signs: Temp: 99.5 F (37.5 C) (12/22 2342) Temp Source: Oral (12/22 2342) BP: 126/89 (12/22 2342) Pulse Rate: 103 (12/22 2342)  Labs: Recent Labs    07/21/18 2359  WBC 11.9*  HGB 12.5  PLT 287    CrCl cannot be calculated (Patient's most recent lab result is older than the maximum 21 days allowed.).     Microbiology: No results found for this or any previous visit (from the past 720 hour(s)).  Medical History: Past Medical History:  Diagnosis Date  . Anxiety   . Depression     Assessment: 32 yr old female with cellulitis of right 3rd finger.  BMP pending.    Goal of Therapy:  Vancomycin trough level 10-15 mcg/ml  Plan:  Preliminary review of pertinent patient information completed.  Protocol will be initiated with dose(s) of Vancomycin 2 gram loading dose x1.  [facility] clinical pharmacist will complete review during morning rounds to assess patient and finalize treatment regimen if needed.  Natasha BenceCline, Jadie Comas, [facility] 07/22/2018,12:38 AM

## 2018-07-23 DIAGNOSIS — F988 Other specified behavioral and emotional disorders with onset usually occurring in childhood and adolescence: Secondary | ICD-10-CM | POA: Diagnosis present

## 2018-07-23 DIAGNOSIS — F329 Major depressive disorder, single episode, unspecified: Secondary | ICD-10-CM | POA: Diagnosis present

## 2018-07-23 DIAGNOSIS — Z87891 Personal history of nicotine dependence: Secondary | ICD-10-CM | POA: Diagnosis not present

## 2018-07-23 DIAGNOSIS — Z818 Family history of other mental and behavioral disorders: Secondary | ICD-10-CM | POA: Diagnosis not present

## 2018-07-23 DIAGNOSIS — L03113 Cellulitis of right upper limb: Secondary | ICD-10-CM | POA: Diagnosis present

## 2018-07-23 DIAGNOSIS — Z79899 Other long term (current) drug therapy: Secondary | ICD-10-CM | POA: Diagnosis not present

## 2018-07-23 DIAGNOSIS — M79644 Pain in right finger(s): Secondary | ICD-10-CM | POA: Diagnosis present

## 2018-07-23 DIAGNOSIS — F411 Generalized anxiety disorder: Secondary | ICD-10-CM | POA: Diagnosis not present

## 2018-07-23 DIAGNOSIS — Z23 Encounter for immunization: Secondary | ICD-10-CM | POA: Diagnosis not present

## 2018-07-23 DIAGNOSIS — L03019 Cellulitis of unspecified finger: Secondary | ICD-10-CM | POA: Diagnosis not present

## 2018-07-23 DIAGNOSIS — L02511 Cutaneous abscess of right hand: Secondary | ICD-10-CM | POA: Diagnosis present

## 2018-07-23 DIAGNOSIS — Z6841 Body Mass Index (BMI) 40.0 and over, adult: Secondary | ICD-10-CM | POA: Diagnosis not present

## 2018-07-23 DIAGNOSIS — Z91040 Latex allergy status: Secondary | ICD-10-CM | POA: Diagnosis not present

## 2018-07-23 DIAGNOSIS — E668 Other obesity: Secondary | ICD-10-CM | POA: Diagnosis present

## 2018-07-23 MED ORDER — MAGNESIUM SULFATE (LAXATIVE) PO GRAN
5.0000 g | GRANULES | Freq: Two times a day (BID) | ORAL | Status: DC
  Administered 2018-07-23 – 2018-07-24 (×2): 5 g via ORAL
  Filled 2018-07-23: qty 454

## 2018-07-23 NOTE — Progress Notes (Signed)
Patient ID: Tara Randall, female   DOB: [birth year], 32 y.o.   MRN: [id number] BP 132/64   Pulse 75   Temp (!) 97.5 F (36.4 C) (Oral)   Resp 16   Ht 5\' 6"  (1.676 m)   Wt 124.7 kg   SpO2 99%   BMI 44.39 kg/m   07/21/2018   This will be the second full day of vancomycin for the infection of the long finger  CBC Latest Ref Rng & Units 07/21/2018 05/21/2015  WBC 4.0 - 10.5 K/uL 11.9(H) 8.3  Hemoglobin 12.0 - 15.0 g/dL 95.612.5 21.313.4  Hematocrit 08.636.0 - 46.0 % 39.3 40.2  Platelets 150 - 400 K/uL 287 249   Erythrocyte Sedimentation Rate     Component Value Date/Time   ESRSEDRATE 41 (H) 07/21/2018 2320   Initial C-reactive protein was 8  Her finger continues to improve she does have some lymph node enlargement on the medial elbow but her erythema has now decreased to the area of the MTP joint and distally her pain is improved and I think she will be able to go home tomorrow on oral antibiotics.

## 2018-07-23 NOTE — Progress Notes (Signed)
PROGRESS NOTE    Tara Randall  [id number] DOB: 09-27-1985 DOA: 07/21/2018 PCP: Lindley MagnusLong, Ashley, PA-C    Assessment & Plan:   Principal Problem:   Cellulitis Active Problems:   GAD (generalized anxiety disorder)   ADD (attention deficit disorder)   Cellulitis of hand   1. Cellulitis of third digit on right hand.  Currently on intravenous vancomycin.  Clinically she appears to be improving.  Orthopedics following.  No further fever.  Swelling and erythema is better.  Anticipate transitioning to oral antibiotics tomorrow.  Possible discharge in a.m.   DVT prophylaxis: SCDs Code Status: Full code Family Communication: No family present Disposition Plan: Discharge home once improved   Consultants:   Orthopedics  Procedures:     Antimicrobials:   Vancomycin 12/23 >   Subjective: Feeling better.  Swelling is improving in right hand.  Able to make a fist now.  Pain is better.  Objective: Vitals:   07/22/18 1420 07/22/18 2147 07/23/18 0613 07/23/18 1700  BP: 123/68 (!) 124/56 132/64 129/68  Pulse: 79 78 75 84  Resp: 18 16 16 18   Temp: (!) 97.5 F (36.4 C) 98 F (36.7 C) (!) 97.5 F (36.4 C) 97.8 F (36.6 C)  TempSrc:  Oral Oral   SpO2: 100% 100% 99% 99%  Weight:      Height:        Intake/Output Summary (Last 24 hours) at 07/23/2018 2009 Last data filed at 07/23/2018 1400 Gross per 24 hour  Intake 637 ml  Output -  Net 637 ml   Filed Weights   07/21/18 2345  Weight: 124.7 kg    Examination:  General exam: Appears calm and comfortable  Respiratory system: Clear to auscultation. Respiratory effort normal. Cardiovascular system: S1 & S2 heard, RRR. No JVD, murmurs, rubs, gallops or clicks. No pedal edema. Gastrointestinal system: Abdomen is nondistended, soft and nontender. No organomegaly or masses felt. Normal bowel sounds heard. Central nervous system: Alert and oriented. No focal neurological deficits. Extremities: Symmetric 5 x 5 power. Skin:  Swelling in right hand is improving.  Erythema is better.  Some discharge noted from wound on radial aspect of right long finger Psychiatry: Judgement and insight appear normal. Mood & affect appropriate.     Data Reviewed: I have personally reviewed following labs and imaging studies  CBC: Recent Labs  Lab 07/21/18 2359  WBC 11.9*  NEUTROABS 9.2*  HGB 12.5  HCT 39.3  MCV 101.0*  PLT 287   Basic Metabolic Panel: Recent Labs  Lab 07/21/18 2359  NA 133*  K 3.1*  CL 99  CO2 23  GLUCOSE 105*  BUN 12  CREATININE 1.12*  CALCIUM 9.0   GFR: Estimated Creatinine Clearance: 97.3 mL/min (A) (by C-G formula based on SCr of 1.12 mg/dL (H)). Liver Function Tests: No results for input(s): AST, ALT, ALKPHOS, BILITOT, PROT, ALBUMIN in the last 168 hours. No results for input(s): LIPASE, AMYLASE in the last 168 hours. No results for input(s): AMMONIA in the last 168 hours. Coagulation Profile: No results for input(s): INR, PROTIME in the last 168 hours. Cardiac Enzymes: No results for input(s): CKTOTAL, CKMB, CKMBINDEX, TROPONINI in the last 168 hours. BNP (last 3 results) No results for input(s): PROBNP in the last 8760 hours. HbA1C: No results for input(s): HGBA1C in the last 72 hours. CBG: No results for input(s): GLUCAP in the last 168 hours. Lipid Profile: No results for input(s): CHOL, HDL, LDLCALC, TRIG, CHOLHDL, LDLDIRECT in the last 72 hours. Thyroid Function Tests:  No results for input(s): TSH, T4TOTAL, FREET4, T3FREE, THYROIDAB in the last 72 hours. Anemia Panel: No results for input(s): VITAMINB12, FOLATE, FERRITIN, TIBC, IRON, RETICCTPCT in the last 72 hours. Sepsis Labs: No results for input(s): PROCALCITON, LATICACIDVEN in the last 168 hours.  Recent Results (from the past 240 hour(s))  Wound or Superficial Culture     Status: None (Preliminary result)   Collection Time: 07/22/18  2:01 AM  Result Value Ref Range Status   Specimen Description   Final     ABSCESS HAND Performed at [facility], [address] [place], [place] [address]    Special Requests   Final    NONE Performed at [facility], [address] [place], [place] [address]    Gram Stain   Final    NO WBC SEEN NO ORGANISMS SEEN Performed at [facility], [address] [place], [place] [address]    Culture FEW GROUP A STREP (S.PYOGENES) ISOLATED  Final   Report Status PENDING  Incomplete         Radiology Studies: Dg Hand Complete Right  Result Date: 07/22/2018 CLINICAL DATA:  Laceration to the right third finger, with erythema and swelling. EXAM: RIGHT HAND - COMPLETE 3+ VIEW COMPARISON:  None. FINDINGS: There is no evidence of fracture or dislocation. The joint spaces are preserved. The carpal rows are intact, and demonstrate normal alignment. The known soft tissue laceration is not well characterized on radiograph. Soft tissue swelling is noted about the third digit. No radiopaque foreign bodies are seen. IMPRESSION: No evidence of fracture or dislocation. No radiopaque foreign bodies seen. Electronically Signed   By: Roanna RaiderJeffery  Chang M.D.   On: 07/22/2018 01:04        Scheduled Meds: . magnesium sulfate  5 g Oral BID  . sodium chloride flush  3 mL Intravenous Q12H   Continuous Infusions: . sodium chloride    . vancomycin 1,000 mg (07/23/18 1325)     LOS: 0 days    Time spent: 30mins    Erick BlinksJehanzeb Memon, MD [facility] Pager [phone]  If 7PM-7AM, please contact night-coverage [email or web address] Password [email or web address] 07/23/2018, 8:09 PM

## 2018-07-24 LAB — C-REACTIVE PROTEIN: CRP: 2.1 mg/dL — ABNORMAL HIGH (ref <1.0)

## 2018-07-24 LAB — AEROBIC CULTURE W GRAM STAIN (SUPERFICIAL SPECIMEN): Gram Stain: NONE SEEN

## 2018-07-24 LAB — CREATININE, SERUM
Creatinine, Ser: 0.88 mg/dL (ref 0.44–1.00)
GFR calc Af Amer: >60 mL/min (ref >60)
GFR calc non Af Amer: >60 mL/min (ref >60)

## 2018-07-24 MED ORDER — DOXYCYCLINE HYCLATE 100 MG PO CAPS: 100.0000 mg | ORAL_CAPSULE | Freq: Two times a day (BID) | ORAL | 0 refills | Status: AC

## 2018-07-24 NOTE — Discharge Summary (Signed)
Physician Discharge Summary  Tara Randall [id number] DOB: May 27, 1986 DOA: 07/21/2018  PCP: Lindley MagnusLong, Ashley, PA-C  Admit date: 07/21/2018 Discharge date: 07/24/2018  Admitted From: Home Disposition: Home  Recommendations for Outpatient Follow-up:  1. Follow up with PCP in 1-2 weeks 2. Please obtain BMP/CBC in one week 3. Follow-up with Dr. Romeo AppleHarrison in 1 to 2 weeks  Discharge Condition: Stable CODE STATUS: Full code Diet recommendation: Regular diet  Brief/Interim Summary: This is a 32 year old female who was admitted to the hospital with cellulitis of her third digit on her right hand.  She had been wrapping presents for Christmas and may have suffered a cut on her finger.  By the following morning, this had become significantly erythematous, swollen and tender with streaking up her arm.  She was seen in the ED and underwent incision and drainage of small abscess on right long finger.  She was treated with intravenous vancomycin.  Overall cellulitis has improved.  Erythema/streaking has resolved and swelling is better.  She is now able to move her fingers freely and able to make a fist.  She was followed by orthopedics in the hospital and did not require further surgical intervention.  She is been transitioned to oral doxycycline.  She will follow-up with orthopedics as an outpatient.  Discharge Diagnoses:  Principal Problem:   Cellulitis Active Problems:   GAD (generalized anxiety disorder)   ADD (attention deficit disorder)   Cellulitis of hand    Discharge Instructions  Discharge Instructions    Diet - low sodium heart healthy   Complete by:  As directed    Increase activity slowly   Complete by:  As directed      Allergies as of 07/24/2018      Reactions   Latex Rash      Medication List    STOP taking these medications   amoxicillin 500 MG capsule Commonly known as:  AMOXIL     TAKE these medications   amphetamine-dextroamphetamine 10 MG 24 hr  capsule Commonly known as:  ADDERALL XR Take 1 capsule (10 mg total) by mouth 2 (two) times daily. What changed:  Another medication with the same name was removed. Continue taking this medication, and follow the directions you see here.   buPROPion 75 MG tablet Commonly known as:  WELLBUTRIN Take 75 mg by mouth 2 (two) times daily. What changed:  Another medication with the same name was removed. Continue taking this medication, and follow the directions you see here.   doxycycline 100 MG capsule Commonly known as:  VIBRAMYCIN Take 1 capsule (100 mg total) by mouth 2 (two) times daily.   EQ ALLERGY RELIEF 10 MG tablet Generic drug:  loratadine TAKE ONE TABLET BY MOUTH AT BEDTIME   famotidine 20 MG tablet Commonly known as:  PEPCID Take 1 tablet (20 mg total) by mouth daily.   FLUoxetine 40 MG capsule Commonly known as:  PROZAC Take 1 capsule (40 mg total) by mouth daily.   lamoTRIgine 25 MG tablet Commonly known as:  LAMICTAL Take 1 tablet (25 mg total) by mouth daily.   traZODone 50 MG tablet Commonly known as:  DESYREL TAKE ONE-HALF TABLET BY MOUTH AT BEDTIME AS NEEDED FOR SLEEP   TRI-PREVIFEM 0.18/0.215/0.25 MG-35 MCG tablet Generic drug:  Norgestimate-Ethinyl Estradiol Triphasic TAKE 1 TABLET BY MOUTH DAILY.       Allergies  Allergen Reactions  . Latex Rash    Consultations:  Orthopedics   Procedures/Studies: Dg Hand Complete Right  Result Date: 07/22/2018  CLINICAL DATA:  Laceration to the right third finger, with erythema and swelling. EXAM: RIGHT HAND - COMPLETE 3+ VIEW COMPARISON:  None. FINDINGS: There is no evidence of fracture or dislocation. The joint spaces are preserved. The carpal rows are intact, and demonstrate normal alignment. The known soft tissue laceration is not well characterized on radiograph. Soft tissue swelling is noted about the third digit. No radiopaque foreign bodies are seen. IMPRESSION: No evidence of fracture or dislocation. No  radiopaque foreign bodies seen. Electronically Signed   By: Roanna Raider M.D.   On: 07/22/2018 01:04       Subjective: Feeling better.  Pain in hand is improved.  Swelling is better.  Discharge Exam: Vitals:   07/23/18 0613 07/23/18 1700 07/23/18 2115 07/24/18 0515  BP: 132/64 129/68 126/71 138/67  Pulse: 75 84 95 74  Resp: 16 18    Temp: (!) 97.5 F (36.4 C) 97.8 F (36.6 C) 98.4 F (36.9 C) 98.7 F (37.1 C)  TempSrc: Oral  Oral Oral  SpO2: 99% 99% 100% 97%  Weight:      Height:        General: Pt is alert, awake, not in acute distress Cardiovascular: RRR, S1/S2 +, no rubs, no gallops Respiratory: CTA bilaterally, no wheezing, no rhonchi Abdominal: Soft, NT, ND, bowel sounds + Extremities: Erythema and right hand has resolved.  Overall swelling is better.    The results of significant diagnostics from this hospitalization (including imaging, microbiology, ancillary and laboratory) are listed below for reference.     Microbiology: Recent Results (from the past 240 hour(s))  Wound or Superficial Culture     Status: None   Collection Time: 07/22/18  2:01 AM  Result Value Ref Range Status   Specimen Description   Final    ABSCESS HAND Performed at [facility], [address], [place], [place] [address]    Special Requests   Final    NONE Performed at [facility], [address] [place], [place] [address]    Gram Stain   Final    NO WBC SEEN NO ORGANISMS SEEN Performed at [facility], [address], [place], [place] [address]    Culture FEW GROUP A STREP (S.PYOGENES) ISOLATED  Final   Report Status 07/24/2018 FINAL  Final     Labs: BNP (last 3 results) No results for input(s): BNP in the last 8760 hours. Basic Metabolic Panel: Recent Labs  Lab 07/21/18 2359 07/24/18 0601  NA 133*  --   K 3.1*  --   CL 99  --   CO2 23  --   GLUCOSE 105*  --   BUN 12  --   CREATININE 1.12* 0.88  CALCIUM 9.0  --    Liver Function Tests: No  results for input(s): AST, ALT, ALKPHOS, BILITOT, PROT, ALBUMIN in the last 168 hours. No results for input(s): LIPASE, AMYLASE in the last 168 hours. No results for input(s): AMMONIA in the last 168 hours. CBC: Recent Labs  Lab 07/21/18 2359  WBC 11.9*  NEUTROABS 9.2*  HGB 12.5  HCT 39.3  MCV 101.0*  PLT 287   Cardiac Enzymes: No results for input(s): CKTOTAL, CKMB, CKMBINDEX, TROPONINI in the last 168 hours. BNP: Invalid input(s): POCBNP CBG: No results for input(s): GLUCAP in the last 168 hours. D-Dimer No results for input(s): DDIMER in the last 72 hours. Hgb A1c No results for input(s): HGBA1C in the last 72 hours. Lipid Profile No results for input(s):  CHOL, HDL, LDLCALC, TRIG, CHOLHDL, LDLDIRECT in the last 72 hours. Thyroid function studies No results for input(s): TSH, T4TOTAL, T3FREE, THYROIDAB in the last 72 hours.  Invalid input(s): FREET3 Anemia work up No results for input(s): VITAMINB12, FOLATE, FERRITIN, TIBC, IRON, RETICCTPCT in the last 72 hours. Urinalysis No results found for: COLORURINE, APPEARANCEUR, LABSPEC, PHURINE, GLUCOSEU, HGBUR, BILIRUBINUR, KETONESUR, PROTEINUR, UROBILINOGEN, NITRITE, LEUKOCYTESUR Sepsis Labs Invalid input(s): PROCALCITONIN,  WBC,  LACTICIDVEN Microbiology Recent Results (from the past 240 hour(s))  Wound or Superficial Culture     Status: None   Collection Time: 07/22/18  2:01 AM  Result Value Ref Range Status   Specimen Description   Final    ABSCESS HAND Performed at [facility], [address] [place], [place] [address]    Special Requests   Final    NONE Performed at [facility], [address] [place], [place] [address]    Gram Stain   Final    NO WBC SEEN NO ORGANISMS SEEN Performed at [facility], [address] [place], [place] [address]    Culture FEW GROUP A STREP (S.PYOGENES) ISOLATED  Final   Report Status 07/24/2018 FINAL  Final     Time coordinating discharge:  35mins  SIGNED:   Erick BlinksJehanzeb Antwian Santaana, MD  [facility] 07/24/2018, 6:31 PM Pager   If 7PM-7AM, please contact night-coverage [email or web address] Password [email or web address]

## 2018-07-24 NOTE — Progress Notes (Signed)
Patient discharged home today per MD orders. Patient vital signs WDL. IV removed and site WDL. Discharge Instructions including follow up appointments, medications, and education reviewed with patient. Patient verbalizes understanding. Patient is transported out via wheelchair.  

## 2019-05-23 ENCOUNTER — Other Ambulatory Visit: Payer: Self-pay

## 2019-05-23 ENCOUNTER — Emergency Department (HOSPITAL_COMMUNITY)
Admission: EM | Admit: 2019-05-23 | Discharge: 2019-05-23 | Disposition: A | Discharge status: Home / Self Care | Payer: Medicaid Other | Attending: Emergency Medicine | Admitting: Emergency Medicine

## 2019-05-23 ENCOUNTER — Emergency Department (HOSPITAL_COMMUNITY): Payer: Medicaid Other

## 2019-05-23 ENCOUNTER — Encounter (HOSPITAL_COMMUNITY): Payer: Self-pay

## 2019-05-23 DIAGNOSIS — R519 Headache, unspecified: Secondary | ICD-10-CM | POA: Diagnosis not present

## 2019-05-23 DIAGNOSIS — Y999 Unspecified external cause status: Secondary | ICD-10-CM | POA: Diagnosis not present

## 2019-05-23 DIAGNOSIS — S199XXA Unspecified injury of neck, initial encounter: Secondary | ICD-10-CM | POA: Diagnosis present

## 2019-05-23 DIAGNOSIS — Z79899 Other long term (current) drug therapy: Secondary | ICD-10-CM | POA: Diagnosis not present

## 2019-05-23 DIAGNOSIS — Y92410 Unspecified street and highway as the place of occurrence of the external cause: Secondary | ICD-10-CM | POA: Diagnosis not present

## 2019-05-23 DIAGNOSIS — Z91040 Latex allergy status: Secondary | ICD-10-CM | POA: Insufficient documentation

## 2019-05-23 DIAGNOSIS — Y9389 Activity, other specified: Secondary | ICD-10-CM | POA: Insufficient documentation

## 2019-05-23 DIAGNOSIS — Z87891 Personal history of nicotine dependence: Secondary | ICD-10-CM | POA: Insufficient documentation

## 2019-05-23 DIAGNOSIS — S161XXA Strain of muscle, fascia and tendon at neck level, initial encounter: Secondary | ICD-10-CM | POA: Insufficient documentation

## 2019-05-23 LAB — POC URINE PREG, ED: Preg Test, Ur: NEGATIVE

## 2019-05-23 MED ORDER — HYDROCODONE-ACETAMINOPHEN 5-325 MG PO TABS
1.0000 | ORAL_TABLET | Freq: Once | ORAL | Status: AC
  Administered 2019-05-23: 1 via ORAL
  Filled 2019-05-23: qty 1

## 2019-05-23 MED ORDER — NAPROXEN 500 MG PO TABS: 500.0000 mg | ORAL_TABLET | Freq: Two times a day (BID) | ORAL | 0 refills | Status: AC | PRN

## 2019-05-23 MED ORDER — IBUPROFEN 400 MG PO TABS
400.0000 mg | ORAL_TABLET | Freq: Once | ORAL | Status: AC
  Administered 2019-05-23: 03:00:00 400 mg via ORAL
  Filled 2019-05-23: qty 1

## 2019-05-23 NOTE — ED Notes (Signed)
Pt drinking water.   Pt ambulating to bathroom without assistance, steady gait noted, denies dizziness or weakness at this time.
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
## 2019-05-23 NOTE — ED Notes (Signed)
Pt ambulating down hallway and in room. Stating she feels much better and is ready for discharge.
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
## 2019-05-23 NOTE — Discharge Instructions (Signed)
Your testing is reassuring.  Take anti-inflammatories as prescribed.  Follow-up with your doctor.  ED with new or worsening symptoms including weakness, numbness, tingling, or other concerns

## 2019-12-11 ENCOUNTER — Encounter (HOSPITAL_COMMUNITY): Payer: Self-pay | Admitting: *Deleted

## 2019-12-11 ENCOUNTER — Other Ambulatory Visit: Payer: Self-pay

## 2019-12-11 ENCOUNTER — Emergency Department (HOSPITAL_COMMUNITY)
Admission: EM | Admit: 2019-12-11 | Discharge: 2019-12-12 | Disposition: A | Discharge status: Home / Self Care | Payer: Medicaid Other | Attending: Emergency Medicine | Admitting: Emergency Medicine

## 2019-12-11 DIAGNOSIS — L03113 Cellulitis of right upper limb: Secondary | ICD-10-CM | POA: Insufficient documentation

## 2019-12-11 DIAGNOSIS — A280 Pasteurellosis: Secondary | ICD-10-CM | POA: Diagnosis not present

## 2019-12-11 DIAGNOSIS — W5501XA Bitten by cat, initial encounter: Secondary | ICD-10-CM | POA: Insufficient documentation

## 2019-12-11 DIAGNOSIS — Z87891 Personal history of nicotine dependence: Secondary | ICD-10-CM | POA: Insufficient documentation

## 2019-12-11 DIAGNOSIS — L539 Erythematous condition, unspecified: Secondary | ICD-10-CM | POA: Diagnosis present

## 2019-12-11 NOTE — ED Provider Notes (Signed)
[facility]-EMERGENCY DEPT [facility] Emergency Department Provider Note MRN:  [id number]  Arrival date & time: 12/12/19     Chief Complaint   Animal Bite   History of Present Illness   Tara Randall is a 34 y.o. year-old female with a history of anxiety and depression presenting to the ED with chief complaint of animal.  Bit by her cat yesterday, saw her doctor and was started on Augmentin today.  Has taken 1 dose of the antibiotic.  Has noticed some increase pain and redness to the area.  Was bit on her right hand.  Denies fever, no other symptoms.  Symptoms are mild to moderate, constant, worse with motion or palpation.  Review of Systems  A complete 10 system review of systems was obtained and all systems are negative except as noted in the HPI and PMH.   Patient's Health History    Past Medical History:  Diagnosis Date  . Anxiety   . Depression     Past Surgical History:  Procedure Laterality Date  . APPENDECTOMY      Family History  Problem Relation Age of Onset  . Depression Mother        Bi polar,manic  . Heart disease Father   . Depression Father   . ADD / ADHD Brother     Social History   Socioeconomic History  . Marital status: Single    Spouse name: Not on file  . Number of children: Not on file  . Years of education: Not on file  . Highest education level: Not on file  Occupational History  . Not on file  Tobacco Use  . Smoking status: Former Games developer  . Smokeless tobacco: Never Used  Substance and Sexual Activity  . Alcohol use: Yes    Comment: 1 x week  . Drug use: No  . Sexual activity: Yes    Birth control/protection: Pill  Other Topics Concern  . Not on file  Social History Narrative  . Not on file   Social Determinants of Health   Financial Resource Strain:   . Difficulty of Paying Living Expenses:   Food Insecurity:   . Worried About Programme researcher, broadcasting/film/video in the Last Year:   . Barista in the Last Year:   Transportation Needs:     . Freight forwarder (Medical):   Marland Kitchen Lack of Transportation (Non-Medical):   Physical Activity:   . Days of Exercise per Week:   . Minutes of Exercise per Session:   Stress:   . Feeling of Stress :   Social Connections:   . Frequency of Communication with Friends and Family:   . Frequency of Social Gatherings with Friends and Family:   . Attends Religious Services:   . Active Member of Clubs or Organizations:   . Attends Banker Meetings:   Marland Kitchen Marital Status:   Intimate Partner Violence:   . Fear of Current or Ex-Partner:   . Emotionally Abused:   Marland Kitchen Physically Abused:   . Sexually Abused:      Physical Exam   Vitals:   12/11/19 2253  BP: (!) 134/101  Pulse: (!) 106  Resp: 16  Temp: 98.6 F (37 C)  SpO2: 99%    CONSTITUTIONAL: Well-appearing, NAD NEURO:  Alert and oriented x 3, no focal deficits EYES:  eyes equal and reactive ENT/NECK:  no LAD, no JVD CARDIO: Regular rate, well-perfused, normal S1 and S2 PULM:  CTAB no wheezing or rhonchi GI/GU:  normal bowel sounds, non-distended, non-tender MSK/SPINE:  No gross deformities, no edema SKIN: Small puncture to the dorsal and ulnar aspect of the right hand with surrounding erythema, minimal edema, neurovascularly intact distally PSYCH:  Appropriate speech and behavior  *Additional and/or pertinent findings included in MDM below  Diagnostic and Interventional Summary    EKG Interpretation  Date/Time:    Ventricular Rate:    PR Interval:    QRS Duration:   QT Interval:    QTC Calculation:   R Axis:     Text Interpretation:        Labs Reviewed - No data to display  DG Hand Complete Right  Final Result      Medications - No data to display   Procedures  /  Critical Care Procedures  ED Course and Medical Decision Making  I have reviewed the triage vital signs, the nursing notes, and pertinent available records from the EMR.  Listed above are laboratory and imaging tests that I  personally ordered, reviewed, and interpreted and then considered in my medical decision making (see below for details).      Suspect cellulitis due to cat bite.  No x-ray was performed at PCP office today, will x-ray to exclude retained tooth.  I would not consider her failed outpatient therapy as she has only had 1 dose of the Augmentin.  If without retained tooth she would be appropriate for continued Augmentin at home with close observation of her symptoms and strict return precautions for worsening.    Barth Kirks. Sedonia Small, [place] [email or web address]  Final Clinical Impressions(s) / ED Diagnoses     ICD-10-CM   1. Pasteurella cellulitis due to cat bite  L03.90    A28.0    [email or web address]     ED Discharge Orders    None       Discharge Instructions Discussed with and Provided to Patient:     Discharge Instructions     You were evaluated in the Emergency Department and after careful evaluation, we did not find any emergent condition requiring admission or further testing in the hospital.  Your exam/testing today was overall reassuring.  Your x-ray did not show any abnormalities or retained teeth.  We encourage you to continue taking the Augmentin as prescribed.  Use Tylenol or Motrin for discomfort.  Keep a close eye on the redness and swelling, it may get slightly worse before it gets better but with significant worsening please come back for repeat evaluation.  Please return to the Emergency Department if you experience any worsening of your condition.  We encourage you to follow up with a primary care provider.  Thank you for allowing [place] to be a part of your care.        Maudie Flakes, MD 12/12/19 [phone]

## 2019-12-11 NOTE — ED Triage Notes (Signed)
Pt c/o cat bite to right hand last night, was seen at [facility] PCP today, given antibiotics but states that the pain and swelling  has gotten worse.

## 2019-12-12 ENCOUNTER — Emergency Department (HOSPITAL_COMMUNITY): Payer: Medicaid Other

## 2019-12-12 NOTE — Discharge Instructions (Addendum)
You were evaluated in the Emergency Department and after careful evaluation, we did not find any emergent condition requiring admission or further testing in the hospital.  Your exam/testing today was overall reassuring.  Your x-ray did not show any abnormalities or retained teeth.  We encourage you to continue taking the Augmentin as prescribed.  Use Tylenol or Motrin for discomfort.  Keep a close eye on the redness and swelling, it may get slightly worse before it gets better but with significant worsening please come back for repeat evaluation.  Please return to the Emergency Department if you experience any worsening of your condition.  We encourage you to follow up with a primary care provider.  Thank you for allowing [place] to be a part of your care.

## 2020-09-25 ENCOUNTER — Other Ambulatory Visit: Payer: Self-pay

## 2020-09-25 ENCOUNTER — Encounter (HOSPITAL_COMMUNITY): Payer: Self-pay | Admitting: Emergency Medicine

## 2020-09-25 ENCOUNTER — Emergency Department (HOSPITAL_COMMUNITY)
Admission: EM | Admit: 2020-09-25 | Discharge: 2020-09-25 | Disposition: A | Discharge status: Home / Self Care | Payer: Medicaid Other | Attending: Emergency Medicine | Admitting: Emergency Medicine

## 2020-09-25 DIAGNOSIS — S51832A Puncture wound without foreign body of left forearm, initial encounter: Secondary | ICD-10-CM | POA: Diagnosis not present

## 2020-09-25 DIAGNOSIS — S61432A Puncture wound without foreign body of left hand, initial encounter: Secondary | ICD-10-CM | POA: Insufficient documentation

## 2020-09-25 DIAGNOSIS — W5501XA Bitten by cat, initial encounter: Secondary | ICD-10-CM | POA: Diagnosis not present

## 2020-09-25 DIAGNOSIS — S61431A Puncture wound without foreign body of right hand, initial encounter: Secondary | ICD-10-CM | POA: Diagnosis not present

## 2020-09-25 DIAGNOSIS — S51831A Puncture wound without foreign body of right forearm, initial encounter: Secondary | ICD-10-CM | POA: Diagnosis not present

## 2020-09-25 DIAGNOSIS — Z87891 Personal history of nicotine dependence: Secondary | ICD-10-CM | POA: Diagnosis not present

## 2020-09-25 DIAGNOSIS — Z91040 Latex allergy status: Secondary | ICD-10-CM | POA: Insufficient documentation

## 2020-09-25 DIAGNOSIS — S6991XA Unspecified injury of right wrist, hand and finger(s), initial encounter: Secondary | ICD-10-CM | POA: Diagnosis present

## 2020-09-25 MED ORDER — IBUPROFEN 800 MG PO TABS
800.0000 mg | ORAL_TABLET | Freq: Once | ORAL | Status: AC
  Administered 2020-09-25: 800 mg via ORAL
  Filled 2020-09-25: qty 1

## 2020-09-25 MED ORDER — HYDROCODONE-ACETAMINOPHEN 5-325 MG PO TABS: 1.0000 | ORAL_TABLET | Freq: Four times a day (QID) | ORAL | 0 refills | Status: AC | PRN

## 2020-09-25 MED ORDER — AMOXICILLIN-POT CLAVULANATE 500-125 MG PO TABS: 1.0000 | ORAL_TABLET | Freq: Three times a day (TID) | ORAL | 0 refills | Status: AC

## 2020-09-25 MED ORDER — HYDROCODONE-ACETAMINOPHEN 5-325 MG PO TABS: 1.0000 | ORAL_TABLET | Freq: Four times a day (QID) | ORAL | 0 refills | Status: DC | PRN

## 2020-09-25 MED ORDER — SODIUM CHLORIDE 0.9 % IV SOLN
3.0000 g | Freq: Once | INTRAVENOUS | Status: AC
  Administered 2020-09-25: 3 g via INTRAVENOUS
  Filled 2020-09-25: qty 8

## 2020-09-25 MED ORDER — AMOXICILLIN-POT CLAVULANATE 500-125 MG PO TABS: 1.0000 | ORAL_TABLET | Freq: Three times a day (TID) | ORAL | 0 refills | Status: DC

## 2020-09-25 NOTE — ED Notes (Signed)
Notified [facility] [employer].

## 2020-09-25 NOTE — Discharge Instructions (Addendum)
Begin taking Augmentin as prescribed.  Begin taking hydrocodone as prescribed as needed for pain.  The animal needs to be quarantined for the next 10 days.  If then will become sicker and dies, or escapes, return to the ER to begin rabies series.  Return to the emergency department if you develop red streaks up the arm, worsening pain, purulent drainage, or other new and concerning symptoms.

## 2020-09-25 NOTE — ED Triage Notes (Signed)
Pt attacked by her own cat tonight. Pt with scratches and bites to bilateral arms and right hand. Pt states cat is not up to date on rabies vaccines.

## 2020-09-25 NOTE — ED Provider Notes (Signed)
[facility] EMERGENCY DEPARTMENT Provider Note   CSN: [id number] Arrival date & time: 09/25/20  0434     History Chief Complaint  Patient presents with  . Animal Bite    Tara Randall is a 35 y.o. female.  Patient is a 35 year old female with no significant past medical history.  She presents today for evaluation of cat bite.  Patient was apparently bitten multiple times to both hands and forearms by her cat when she attempted to go back into the house.  Cats immunization status is unknown.  Patient's tetanus is up-to-date.  The history is provided by the patient.  Animal Bite Contact animal:  Cat Location:  Hand Hand injury location:  L hand and R hand (Forearms bilaterally) Pain details:    Severity:  Severe   Timing:  Constant   Progression:  Worsening Provoked: unprovoked        Past Medical History:  Diagnosis Date  . Anxiety   . Depression     Patient Active Problem List   Diagnosis Date Noted  . Cellulitis 07/22/2018  . Cellulitis of hand   . BMI 40.0-44.9, adult (HCC) 07/06/2015  . Major depressive disorder, recurrent episode, severe, with psychotic behavior (HCC) 05/22/2015  . Cannabis abuse   . Depression 01/26/2014  . GAD (generalized anxiety disorder) 01/26/2014  . ADD (attention deficit disorder) 01/26/2014    Past Surgical History:  Procedure Laterality Date  . APPENDECTOMY       OB History    Gravida  2   Para  1   Term      Preterm  1   AB  1   Living        SAB      IAB  1   Ectopic      Multiple      Live Births              Family History  Problem Relation Age of Onset  . Depression Mother        Bi polar,manic  . Heart disease Father   . Depression Father   . ADD / ADHD Brother     Social History   Tobacco Use  . Smoking status: Former Games developer  . Smokeless tobacco: Never Used  Vaping Use  . Vaping Use: Never used  Substance Use Topics  . Alcohol use: Yes    Comment: 1 x week  . Drug use: No     Home Medications Prior to Admission medications   Medication Sig Start Date End Date Taking? Authorizing Provider  amphetamine-dextroamphetamine (ADDERALL XR) 10 MG 24 hr capsule Take 1 capsule (10 mg total) by mouth 2 (two) times daily. 04/10/16   Johna Sheriff, MD  buPROPion (WELLBUTRIN) 75 MG tablet Take 75 mg by mouth 2 (two) times daily.    [provider]  doxycycline (VIBRAMYCIN) 100 MG capsule Take 1 capsule (100 mg total) by mouth 2 (two) times daily. 07/24/18   Erick Blinks, MD  EQ ALLERGY RELIEF 10 MG tablet TAKE ONE TABLET BY MOUTH AT BEDTIME 08/30/15   Johna Sheriff, MD  famotidine (PEPCID) 20 MG tablet Take 1 tablet (20 mg total) by mouth daily. 10/04/15   Johna Sheriff, MD  FLUoxetine (PROZAC) 40 MG capsule Take 1 capsule (40 mg total) by mouth daily. 10/04/15   Johna Sheriff, MD  lamoTRIgine (LAMICTAL) 25 MG tablet Take 1 tablet (25 mg total) by mouth daily. 10/04/15   Johna Sheriff, MD  naproxen (NAPROSYN) 500 MG tablet Take 1 tablet (500 mg total) by mouth 2 (two) times daily as needed. 05/23/19   Rancour, Jeannett Senior, MD  traZODone (DESYREL) 50 MG tablet TAKE ONE-HALF TABLET BY MOUTH AT BEDTIME AS NEEDED FOR SLEEP 10/04/15   Johna Sheriff, MD  TRI-PREVIFEM 0.18/0.215/0.25 MG-35 MCG tablet TAKE 1 TABLET BY MOUTH DAILY. 09/11/16   Bennie Pierini, FNP    Allergies    Latex  Review of Systems   Review of Systems  All other systems reviewed and are negative.   Physical Exam Updated Vital Signs Ht 5\' 5"  (1.651 m)   Wt 113.4 kg   BMI 41.60 kg/m   Physical Exam Vitals and nursing note reviewed.  Constitutional:      General: She is not in acute distress.    Appearance: Normal appearance. She is not ill-appearing.  HENT:     Head: Normocephalic and atraumatic.  Pulmonary:     Effort: Pulmonary effort is normal.  Musculoskeletal:     Comments: There are multiple puncture bites to both hands and forearms.  Bleeding is controlled.  Skin:     General: Skin is warm and dry.  Neurological:     Mental Status: She is alert and oriented to person, place, and time.     ED Results / Procedures / Treatments   Labs (all labs ordered are listed, but only abnormal results are displayed) Labs Reviewed - No data to display  EKG None  Radiology No results found.  Procedures Procedures   Medications Ordered in ED Medications  Ampicillin-Sulbactam (UNASYN) 3 g in sodium chloride 0.9 % 100 mL IVPB (has no administration in time range)  ibuprofen (ADVIL) tablet 800 mg (has no administration in time range)    ED Course  I have reviewed the triage vital signs and the nursing notes.  Pertinent labs & imaging results that were available during my care of the patient were reviewed by me and considered in my medical decision making (see chart for details).    MDM Rules/Calculators/A&P  Patient is a 35 year old female presenting with complaints of cat bite to both hands and forearms.  Patient apparently attacked by her own cat.  Rabies status of the animal is out of date.  Patient given IV Unasyn and will be discharged with Augmentin.  Patient to call animal control to have the cat quarantined or euthanized.  Patient to return for rabies series if cat gets away or turns out to be rabid.  Final Clinical Impression(s) / ED Diagnoses Final diagnoses:  None    Rx / DC Orders ED Discharge Orders    None       20, MD 09/25/20 [phone]
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
## 2021-08-02 IMAGING — DX DG HAND COMPLETE 3+V*R*
3 series · 3 of 3 positions shown · non-contrast
Comparison: 07/22/2018

CLINICAL DATA: Cat bite, puncture wound between first and second
metacarpal, evaluate for retained foreign body

EXAM:
RIGHT HAND - COMPLETE 3+ VIEW

[hand ap]
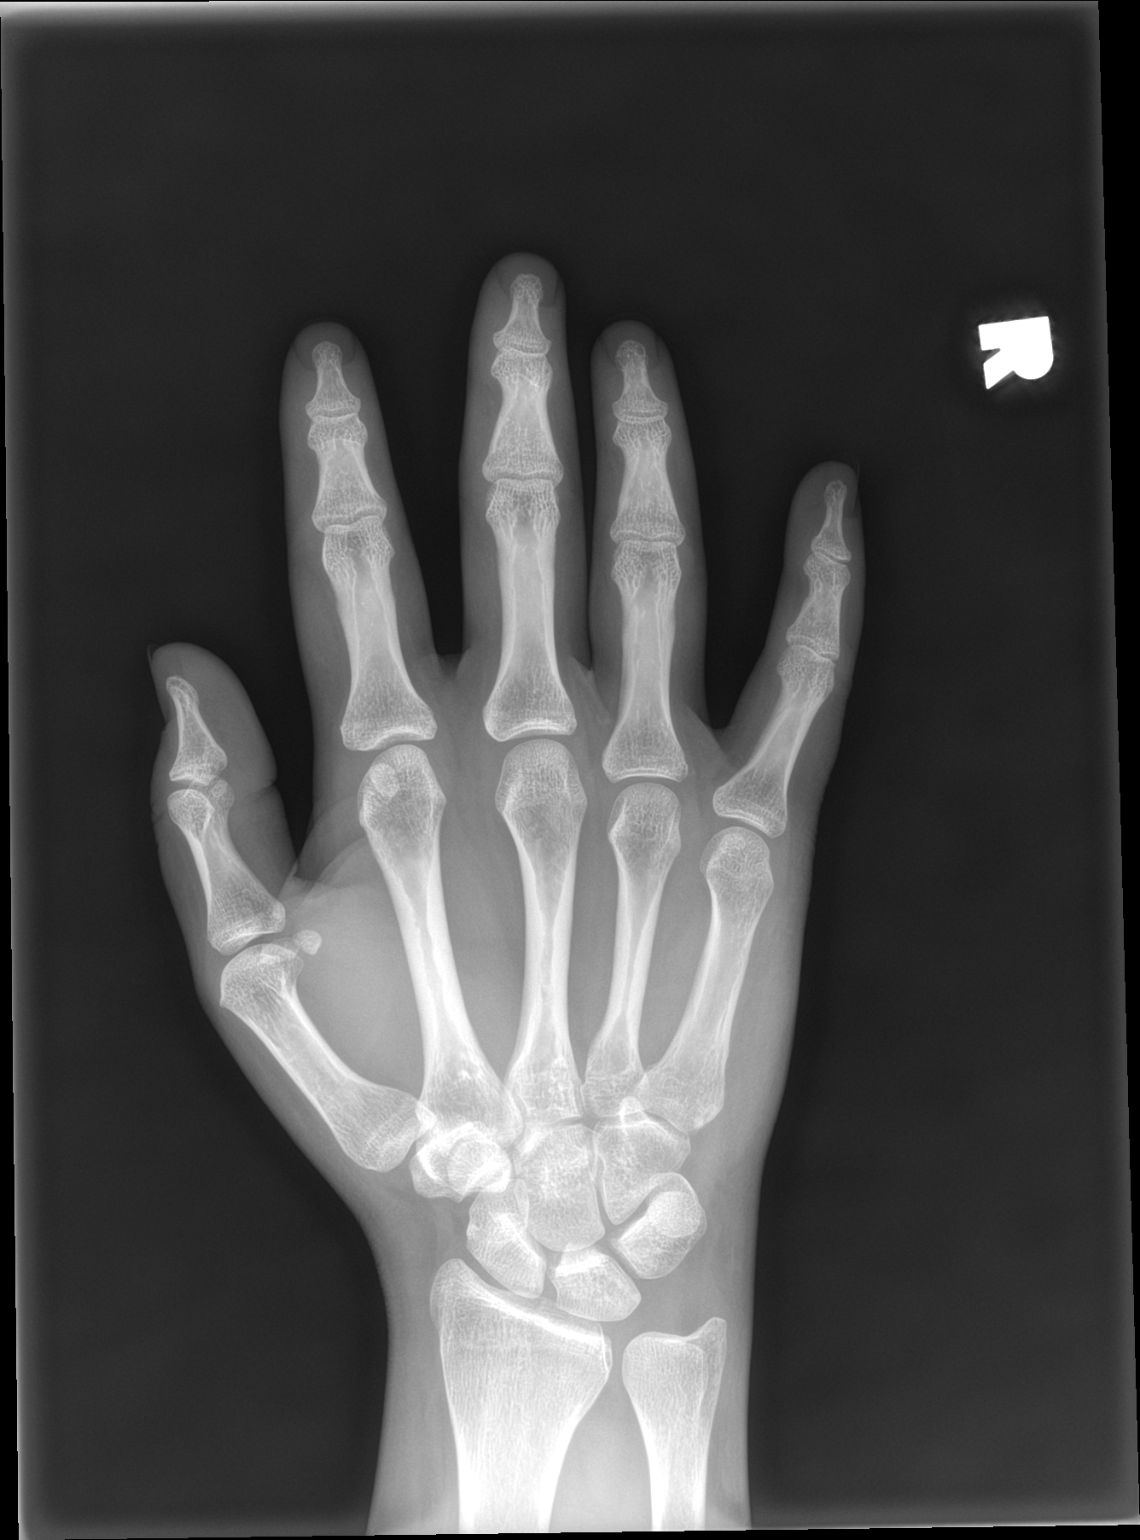

[hand obl]
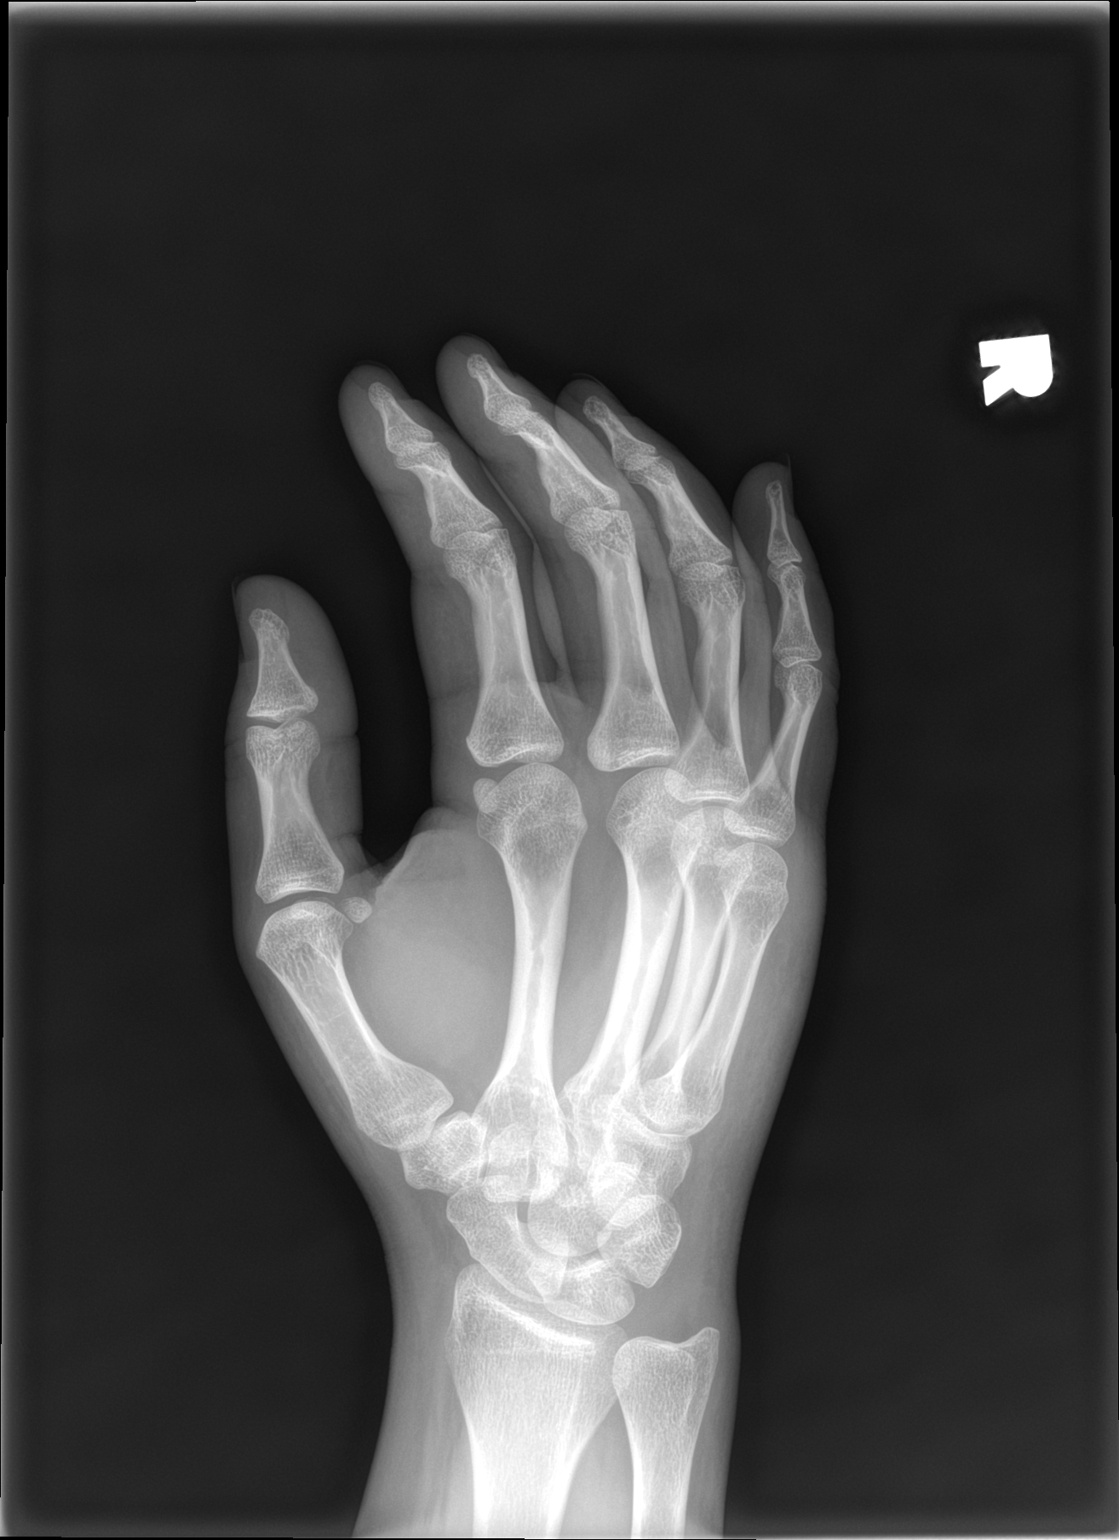

[hand lat]
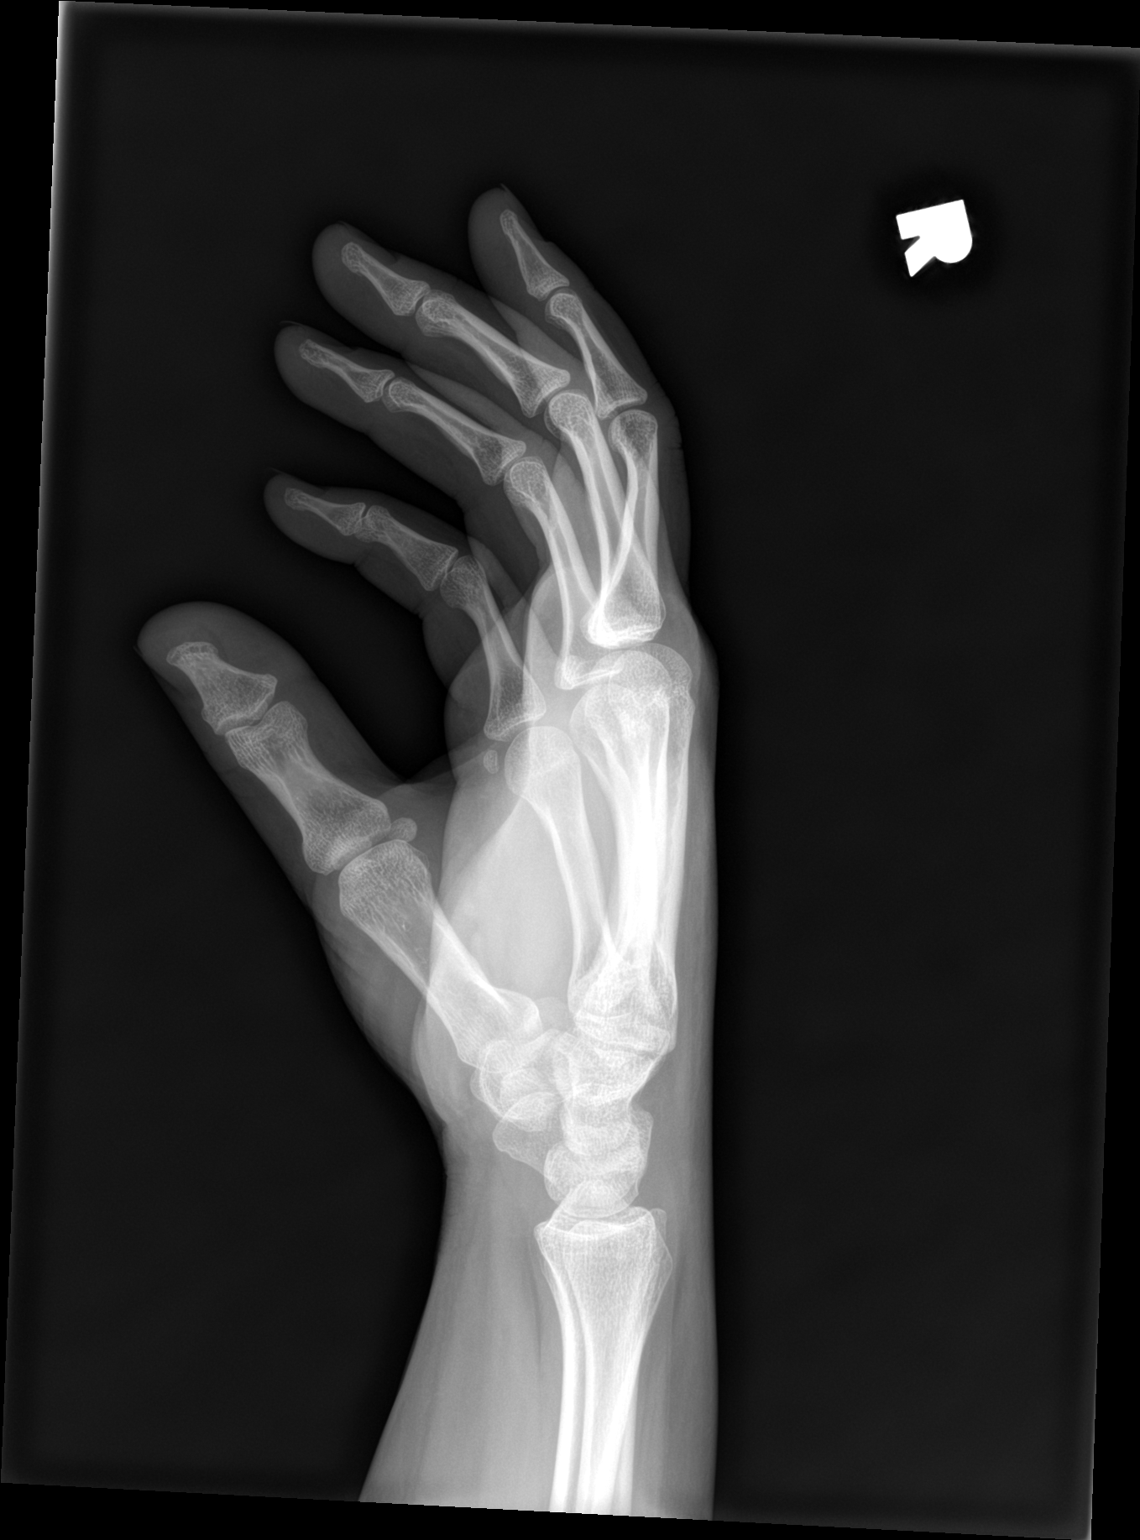

[3 of 3 positions shown; findings below may reference images not displayed]

FINDINGS: Frontal, oblique, and lateral views of the right hand are obtained.
There are no fractures or radiopaque foreign bodies. Joint spaces
are well preserved. Soft tissues are normal.
IMPRESSION: 1. No fracture or radiopaque foreign body.
